# Patient Record
Sex: Female | Born: 1980 | Hispanic: No | Marital: Single | Smoking: Former smoker
Health system: Southern US, Community
[De-identification: ages and names within clinical notes are randomized; demographics above are authoritative.]

## PROBLEM LIST (undated history)

## (undated) DIAGNOSIS — M419 Scoliosis, unspecified: Secondary | ICD-10-CM

## (undated) DIAGNOSIS — K589 Irritable bowel syndrome without diarrhea: Secondary | ICD-10-CM

## (undated) DIAGNOSIS — M797 Fibromyalgia: Secondary | ICD-10-CM

## (undated) DIAGNOSIS — K900 Celiac disease: Secondary | ICD-10-CM

## (undated) DIAGNOSIS — G35 Multiple sclerosis: Secondary | ICD-10-CM

---

## 1999-11-29 ENCOUNTER — Emergency Department (HOSPITAL_COMMUNITY): Admission: EM | Admit: 1999-11-29 | Discharge: 1999-11-29 | Payer: Self-pay | Admitting: Emergency Medicine

## 2001-06-25 ENCOUNTER — Emergency Department (HOSPITAL_COMMUNITY): Admission: EM | Admit: 2001-06-25 | Discharge: 2001-06-25 | Payer: Self-pay | Admitting: Emergency Medicine

## 2001-09-14 ENCOUNTER — Encounter: Payer: Self-pay | Admitting: Emergency Medicine

## 2001-09-14 ENCOUNTER — Emergency Department (HOSPITAL_COMMUNITY): Admission: EM | Admit: 2001-09-14 | Discharge: 2001-09-15 | Payer: Self-pay | Admitting: Emergency Medicine

## 2005-05-16 ENCOUNTER — Ambulatory Visit (HOSPITAL_COMMUNITY): Admission: RE | Admit: 2005-05-16 | Discharge: 2005-05-16 | Payer: Self-pay | Admitting: *Deleted

## 2005-08-28 ENCOUNTER — Encounter: Admission: RE | Admit: 2005-08-28 | Discharge: 2005-08-28 | Payer: Self-pay | Admitting: Psychiatry

## 2010-07-09 ENCOUNTER — Emergency Department (HOSPITAL_BASED_OUTPATIENT_CLINIC_OR_DEPARTMENT_OTHER)
Admission: EM | Admit: 2010-07-09 | Discharge: 2010-07-09 | Disposition: A | Discharge status: Home / Self Care | Payer: Self-pay | Attending: Emergency Medicine | Admitting: Emergency Medicine

## 2010-07-09 DIAGNOSIS — I10 Essential (primary) hypertension: Secondary | ICD-10-CM | POA: Insufficient documentation

## 2010-07-09 DIAGNOSIS — R197 Diarrhea, unspecified: Secondary | ICD-10-CM | POA: Insufficient documentation

## 2010-07-09 DIAGNOSIS — G35 Multiple sclerosis: Secondary | ICD-10-CM | POA: Insufficient documentation

## 2010-07-09 DIAGNOSIS — R112 Nausea with vomiting, unspecified: Secondary | ICD-10-CM | POA: Insufficient documentation

## 2010-07-09 LAB — COMPREHENSIVE METABOLIC PANEL
AST: 20 U/L (ref 0–37)
Albumin: 4.4 g/dL (ref 3.5–5.2)
Alkaline Phosphatase: 62 U/L (ref 39–117)
BUN: 7 mg/dL (ref 6–23)
CO2: 20 mEq/L (ref 19–32)
Calcium: 9.9 mg/dL (ref 8.4–10.5)
Chloride: 106 mEq/L (ref 96–112)
Creatinine, Ser: 0.7 mg/dL (ref 0.50–1.10)
GFR calc Af Amer: >60 mL/min (ref >60)
GFR calc non Af Amer: >60 mL/min (ref >60)
Glucose, Bld: 97 mg/dL (ref 70–99)
Potassium: 3.9 mEq/L (ref 3.5–5.1)
Sodium: 140 mEq/L (ref 135–145)
Total Bilirubin: 0.5 mg/dL (ref 0.3–1.2)
Total Protein: 7.7 g/dL (ref 6.0–8.3)

## 2010-07-09 LAB — URINALYSIS, ROUTINE W REFLEX MICROSCOPIC
Glucose, UA: NEGATIVE mg/dL
Hgb urine dipstick: NEGATIVE
Leukocytes, UA: NEGATIVE
Nitrite: NEGATIVE
Protein, ur: NEGATIVE mg/dL
Specific Gravity, Urine: 1.022 (ref 1.005–1.030)
Urobilinogen, UA: 0.2 mg/dL (ref 0.0–1.0)
pH: 7.5 (ref 5.0–8.0)

## 2010-07-09 LAB — PREGNANCY, URINE: Preg Test, Ur: NEGATIVE

## 2010-10-08 ENCOUNTER — Emergency Department (HOSPITAL_BASED_OUTPATIENT_CLINIC_OR_DEPARTMENT_OTHER)
Admission: EM | Admit: 2010-10-08 | Discharge: 2010-10-08 | Disposition: A | Discharge status: Home / Self Care | Payer: Self-pay | Attending: Emergency Medicine | Admitting: Emergency Medicine

## 2010-10-08 ENCOUNTER — Emergency Department (INDEPENDENT_AMBULATORY_CARE_PROVIDER_SITE_OTHER): Payer: Self-pay

## 2010-10-08 DIAGNOSIS — M25559 Pain in unspecified hip: Secondary | ICD-10-CM | POA: Insufficient documentation

## 2010-10-08 DIAGNOSIS — Z79899 Other long term (current) drug therapy: Secondary | ICD-10-CM | POA: Insufficient documentation

## 2010-10-08 DIAGNOSIS — G35 Multiple sclerosis: Secondary | ICD-10-CM | POA: Insufficient documentation

## 2010-10-08 DIAGNOSIS — J45909 Unspecified asthma, uncomplicated: Secondary | ICD-10-CM | POA: Insufficient documentation

## 2010-10-08 DIAGNOSIS — K589 Irritable bowel syndrome without diarrhea: Secondary | ICD-10-CM | POA: Insufficient documentation

## 2010-10-08 HISTORY — DX: Irritable bowel syndrome without diarrhea: K58.9

## 2010-10-08 HISTORY — DX: Fibromyalgia: M79.7

## 2010-10-08 HISTORY — DX: Celiac disease: K90.0

## 2010-10-08 HISTORY — DX: Scoliosis, unspecified: M41.9

## 2010-10-08 HISTORY — DX: Multiple sclerosis: G35

## 2010-10-08 MED ORDER — HYDROCODONE-ACETAMINOPHEN 5-325 MG PO TABS: 2.0000 | ORAL_TABLET | ORAL | Status: AC | PRN

## 2010-10-08 NOTE — ED Provider Notes (Signed)
History     CSN: [id number] Arrival date & time: 10/08/2010  5:12 PM  Chief Complaint  Patient presents with  . Hip Pain    HPI  (Consider location/radiation/quality/duration/timing/severity/associated sxs/prior treatment)  Patient is a 30 y.o. female presenting with hip pain. The history is provided by the patient. No language interpreter was used.  Hip Pain This is a new problem. The current episode started 1 to 4 weeks ago. The problem occurs constantly. The problem has been gradually worsening. Associated symptoms include joint swelling. The symptoms are aggravated by nothing. She has tried nothing for the symptoms. The treatment provided no relief.  Pt reports she has a history of MS.  Pt reports she does not currently have a physician because she lost her insurance when she finished school.  Past Medical History  Diagnosis Date  . Fibromyalgia   . Multiple sclerosis   . Celiac disease   . Asthma   . IBS (irritable bowel syndrome)   . Scoliosis     History reviewed. No pertinent past surgical history.  No family history on file.  History  Substance Use Topics  . Smoking status: Never Smoker   . Smokeless tobacco: Not on file  . Alcohol Use: Yes    OB History    Grav Para Term Preterm Abortions TAB SAB Ect Mult Living                  Review of Systems  Review of Systems  Musculoskeletal: Positive for joint swelling.  All other systems reviewed and are negative.    Allergies  Penicillins; Sulfa antibiotics; Dayquil; Gluten; Lactulose; Morphine and related; Nyquil; Other; and Zofran  Home Medications   Current Outpatient Rx  Name Route Sig Dispense Refill  . ALPHA LIPOIC ACID PO Oral Take 1 tablet by mouth 2 (two) times daily.      . CHASTE TREE 20 MG PO TABS Oral Take 1 tablet by mouth 2 (two) times daily.      Marland Kitchen VITAMIN D 1000 UNITS PO TABS Oral Take 1,000 Units by mouth daily.      . CO Q 10 100 MG PO CAPS Oral Take 1 tablet by mouth daily.      Marland Kitchen  VITAMIN B-12 IJ Injection Inject 1,000 mg as directed every 7 (seven) days. Give on Wednesday     . EVENING PRIMROSE OIL 500 MG PO CAPS Oral Take 2 capsules by mouth daily.      Marland Kitchen HYDROCODONE-ACETAMINOPHEN 5-325 MG PO TABS Oral Take 1 tablet by mouth once.      . IBUPROFEN 200 MG PO TABS Oral Take 800 mg by mouth every 6 (six) hours as needed. pain     . IODINE (KELP) PO Oral Take 1 tablet by mouth daily.      Marland Kitchen MAGNESIUM GLUCONATE 500 MG PO TABS Oral Take 1,500 mg by mouth daily.      Marland Kitchen ONE-DAILY MULTI VITAMINS PO TABS Oral Take 1 tablet by mouth daily.      Marland Kitchen OVER THE COUNTER MEDICATION Oral Take 1 capsule by mouth 2 (two) times daily. Myelin Capsules       Physical Exam    BP 145/101  Pulse 85  Temp 98.3 F (36.8 C)  Resp 16  Ht 5\' 4"  (1.626 m)  Wt 163 lb (73.936 kg)  BMI 27.98 kg/m2  SpO2 100%  Physical Exam  Nursing note and vitals reviewed. Constitutional: She is oriented to person, place, and time. She appears  well-developed and well-nourished.  HENT:  Head: Normocephalic.  Neck: Normal range of motion.  Pulmonary/Chest: Effort normal.  Abdominal: Soft.  Musculoskeletal: She exhibits tenderness.       Pain with flexion of hip,  Pain with internal and external rotation  Neurological: She is alert and oriented to person, place, and time.  Skin: Skin is warm and dry.  Psychiatric: She has a normal mood and affect.    ED Course  Procedures (including critical care time)  Labs Reviewed - No data to display Dg Hip Complete Right  10/08/2010  *RADIOLOGY REPORT*  Clinical Data: Right hip pain  RIGHT HIP - COMPLETE 2+ VIEW  Comparison: None.  Findings: Three views of the right hip submitted.  No acute fracture or subluxation.  Pelvic phleboliths are noted. Moderate colonic stool.  IMPRESSION: No acute fracture or subluxation.  Original Report Authenticated By: Natasha Mead, M.D.     No diagnosis found.   MDM Pt advised to follow up with Dr. Herbert Moors,  Ice to area of pain.   Pain medication        Langston Masker, [place] 10/08/10 1831  Langston Masker, [place] 10/08/10 [phone]

## 2010-10-08 NOTE — ED Notes (Signed)
Right pedal pulse present. Cap refill <3 secs.

## 2010-10-08 NOTE — ED Notes (Signed)
Pain to right hip x 2 weeks-worse x 3-4 days-"felt it pop doing yoga" 2 weeks ago-unable to walk on it/muscle spasms

## 2010-10-08 NOTE — ED Provider Notes (Signed)
History/physical exam/procedure(s) were performed by non-physician practitioner and as supervising physician I was immediately available for consultation/collaboration. I have reviewed all notes and am in agreement with care and plan.   Hilario Quarry, MD 10/08/10 [phone]

## 2010-10-19 ENCOUNTER — Emergency Department (HOSPITAL_COMMUNITY)
Admission: EM | Admit: 2010-10-19 | Discharge: 2010-10-19 | Disposition: A | Discharge status: Home / Self Care | Payer: Self-pay | Attending: Emergency Medicine | Admitting: Emergency Medicine

## 2010-10-19 ENCOUNTER — Emergency Department (HOSPITAL_COMMUNITY): Payer: Self-pay

## 2010-10-19 DIAGNOSIS — IMO0001 Reserved for inherently not codable concepts without codable children: Secondary | ICD-10-CM | POA: Insufficient documentation

## 2010-10-19 DIAGNOSIS — G35 Multiple sclerosis: Secondary | ICD-10-CM | POA: Insufficient documentation

## 2010-10-19 DIAGNOSIS — I10 Essential (primary) hypertension: Secondary | ICD-10-CM | POA: Insufficient documentation

## 2010-10-19 DIAGNOSIS — M545 Low back pain, unspecified: Secondary | ICD-10-CM | POA: Insufficient documentation

## 2010-10-19 DIAGNOSIS — M79609 Pain in unspecified limb: Secondary | ICD-10-CM | POA: Insufficient documentation

## 2010-10-19 DIAGNOSIS — K900 Celiac disease: Secondary | ICD-10-CM | POA: Insufficient documentation

## 2010-10-19 DIAGNOSIS — M25559 Pain in unspecified hip: Secondary | ICD-10-CM | POA: Insufficient documentation

## 2010-10-19 DIAGNOSIS — M25569 Pain in unspecified knee: Secondary | ICD-10-CM | POA: Insufficient documentation

## 2011-01-12 ENCOUNTER — Encounter (HOSPITAL_COMMUNITY): Payer: Self-pay | Admitting: *Deleted

## 2011-01-12 ENCOUNTER — Emergency Department (HOSPITAL_COMMUNITY)
Admission: EM | Admit: 2011-01-12 | Discharge: 2011-01-12 | Disposition: A | Discharge status: Home / Self Care | Payer: Self-pay | Attending: Emergency Medicine | Admitting: Emergency Medicine

## 2011-01-12 DIAGNOSIS — R5381 Other malaise: Secondary | ICD-10-CM | POA: Insufficient documentation

## 2011-01-12 DIAGNOSIS — R059 Cough, unspecified: Secondary | ICD-10-CM | POA: Insufficient documentation

## 2011-01-12 DIAGNOSIS — H9209 Otalgia, unspecified ear: Secondary | ICD-10-CM | POA: Insufficient documentation

## 2011-01-12 DIAGNOSIS — B349 Viral infection, unspecified: Secondary | ICD-10-CM

## 2011-01-12 DIAGNOSIS — G35 Multiple sclerosis: Secondary | ICD-10-CM | POA: Insufficient documentation

## 2011-01-12 DIAGNOSIS — R112 Nausea with vomiting, unspecified: Secondary | ICD-10-CM | POA: Insufficient documentation

## 2011-01-12 DIAGNOSIS — B9789 Other viral agents as the cause of diseases classified elsewhere: Secondary | ICD-10-CM | POA: Insufficient documentation

## 2011-01-12 DIAGNOSIS — R05 Cough: Secondary | ICD-10-CM | POA: Insufficient documentation

## 2011-01-12 LAB — POCT I-STAT, CHEM 8
BUN: 7 mg/dL (ref 6–23)
Calcium, Ion: 1.18 mmol/L (ref 1.12–1.32)
Chloride: 108 meq/L (ref 96–112)
Creatinine, Ser: 0.9 mg/dL (ref 0.50–1.10)
Glucose, Bld: 84 mg/dL (ref 70–99)
HCT: 37 % (ref 36.0–46.0)
Hemoglobin: 12.6 g/dL (ref 12.0–15.0)
Potassium: 4 meq/L (ref 3.5–5.1)
Sodium: 145 meq/L (ref 135–145)
TCO2: 24 mmol/L (ref 0–100)

## 2011-01-12 MED ORDER — PROMETHAZINE HCL 25 MG/ML IJ SOLN
25.0000 mg | Freq: Once | INTRAMUSCULAR | Status: AC
  Administered 2011-01-12: 25 mg via INTRAMUSCULAR
  Filled 2011-01-12: qty 1

## 2011-01-12 MED ORDER — PROMETHAZINE HCL 25 MG PO TABS: 25.0000 mg | ORAL_TABLET | Freq: Four times a day (QID) | ORAL | Status: AC | PRN

## 2011-01-12 MED ORDER — FAMOTIDINE 20 MG PO TABS: 20.0000 mg | ORAL_TABLET | Freq: Two times a day (BID) | ORAL | Status: DC

## 2011-01-12 NOTE — ED Notes (Signed)
Pt in c/o n/v, fever, body aches, cough and congestion x3-4 days

## 2011-01-12 NOTE — ED Notes (Signed)
Patient is alert and oriented x3.  She has been given DC instructions with MD follow up visits.  Verbal confirmation was given by patient V/S stable.  Patient DC under own ambulatory power. Patient was not showing any signs of distress on DC  

## 2011-01-12 NOTE — ED Provider Notes (Signed)
History     CSN: [id number]  Arrival date & time 01/12/11  0110   First MD Initiated Contact with Patient 01/12/11 [phone]      Chief Complaint  Patient presents with  . Influenza     HPI  History provided by the patient and friend. Patient is a 30 year old female with history of celiac disease, irritable bowel syndrome and fibromyalgia who presents with complaints of generalized body aches, fever, cough and nausea and vomiting. Patient reports his symptoms began on Christmas Eve 4 days ago with fever and cough. Patient also reports having significant amounts of mucus and congestion. For the past 2 days patient reports having persistent nausea and vomiting to the point of not being able to keep any food or liquids down. Patient has not been able to take any medicines without vomiting. She has not reported any alleviating factors. Patient denies any diarrhea or abdominal pains. Patient has no other significant medical problems.   Past Medical History  Diagnosis Date  . Fibromyalgia   . Multiple sclerosis   . Celiac disease   . Asthma   . IBS (irritable bowel syndrome)   . Scoliosis     History reviewed. No pertinent past surgical history.  History reviewed. No pertinent family history.  History  Substance Use Topics  . Smoking status: Never Smoker   . Smokeless tobacco: Not on file  . Alcohol Use: Yes    OB History    Grav Para Term Preterm Abortions TAB SAB Ect Mult Living                  Review of Systems  Constitutional: Positive for fever, chills, activity change, appetite change and fatigue.  HENT: Positive for ear pain and congestion.   Respiratory: Positive for cough. Negative for shortness of breath.   Cardiovascular: Negative for chest pain.  Gastrointestinal: Positive for nausea and vomiting. Negative for abdominal pain, diarrhea and constipation.  Genitourinary: Negative for dysuria, hematuria and flank pain.  Neurological: Negative for dizziness and  light-headedness.  All other systems reviewed and are negative.    Allergies  Penicillins; Sulfa antibiotics; Dayquil; Gluten; Lactulose; Morphine and related; Nyquil; Other; and Zofran  Home Medications   Current Outpatient Rx  Name Route Sig Dispense Refill  . ALPHA LIPOIC ACID PO Oral Take 1 tablet by mouth 2 (two) times daily.      . CHASTE TREE 20 MG PO TABS Oral Take 1 tablet by mouth 2 (two) times daily.      Marland Kitchen VITAMIN D 1000 UNITS PO TABS Oral Take 1,000 Units by mouth daily.      . CO Q 10 100 MG PO CAPS Oral Take 1 tablet by mouth daily.      Marland Kitchen VITAMIN B-12 IJ Injection Inject 1,000 mg as directed every 7 (seven) days. Give on Wednesday     . EVENING PRIMROSE OIL 500 MG PO CAPS Oral Take 2 capsules by mouth daily.      Marland Kitchen HYDROCODONE-ACETAMINOPHEN 5-325 MG PO TABS Oral Take 1 tablet by mouth once.      . IBUPROFEN 200 MG PO TABS Oral Take 800 mg by mouth every 6 (six) hours as needed. pain     . IODINE (KELP) PO Oral Take 1 tablet by mouth daily.      Marland Kitchen MAGNESIUM GLUCONATE 500 MG PO TABS Oral Take 1,500 mg by mouth daily.      Marland Kitchen ONE-DAILY MULTI VITAMINS PO TABS Oral Take 1 tablet by  mouth daily.      Marland Kitchen OVER THE COUNTER MEDICATION Oral Take 1 capsule by mouth 2 (two) times daily. Myelin Capsules       BP 159/100  Pulse 80  Temp(Src) 99 F (37.2 C) (Oral)  Resp 20  SpO2 100%  Physical Exam  Nursing note and vitals reviewed. Constitutional: She is oriented to person, place, and time. She appears well-developed and well-nourished. No distress.  HENT:  Head: Normocephalic.  Right Ear: External ear normal.  Left Ear: External ear normal.       Oropharynx is erythematous with some cobblestoning  Eyes: Conjunctivae and EOM are normal. Pupils are equal, round, and reactive to light.  Neck: Normal range of motion. Neck supple.       No meningeal signs  Cardiovascular: Normal rate, regular rhythm and normal heart sounds.   Pulmonary/Chest: Effort normal and breath sounds  normal. No respiratory distress. She has no wheezes. She has no rales.  Abdominal: Soft. Bowel sounds are normal. She exhibits no distension. There is no tenderness. There is no rebound and no guarding.  Musculoskeletal: She exhibits no edema and no tenderness.  Lymphadenopathy:    She has no cervical adenopathy.  Neurological: She is alert and oriented to person, place, and time.  Skin: Skin is warm and dry. No rash noted.  Psychiatric: She has a normal mood and affect. Her behavior is normal.    ED Course  Procedures (including critical care time)   Labs Reviewed  I-STAT, CHEM 8   Results for orders placed during the hospital encounter of 01/12/11  POCT I-STAT, CHEM 8      Component Value Range   Sodium 145  135 - 145 (mEq/L)   Potassium 4.0  3.5 - 5.1 (mEq/L)   Chloride 108  96 - 112 (mEq/L)   BUN 7  6 - 23 (mg/dL)   Creatinine, Ser 1.61  0.50 - 1.10 (mg/dL)   Glucose, Bld 84  70 - 99 (mg/dL)   Calcium, Ion 0.96  0.45 - 1.32 (mmol/L)   TCO2 24  0 - 100 (mmol/L)   Hemoglobin 12.6  12.0 - 15.0 (g/dL)   HCT 40.9  81.1 - 91.4 (%)      1. Viral infection   2. Nausea & vomiting       MDM  3:20 AM patient seen and evaluated. Patient in no acute distress. Patient is nontoxic appearing   Vision feeling much better after nausea medications. She is tolerating by mouth fluids well. Patient has normal i-STAT. At this time patient feels like she is ready to return home. Plan to give her prescriptions for nausea medications with instructions for simple BRAT diet.     Angus Seller, [place] 01/12/11 [phone]

## 2011-01-12 NOTE — ED Provider Notes (Signed)
Medical screening examination/treatment/procedure(s) were performed by non-physician practitioner and as supervising physician I was immediately available for consultation/collaboration.   Carlo Guevarra M Jacquiline Zurcher, MD 01/12/11 0815 

## 2011-02-27 ENCOUNTER — Emergency Department (HOSPITAL_COMMUNITY)
Admission: EM | Admit: 2011-02-27 | Discharge: 2011-02-28 | Disposition: A | Discharge status: Home / Self Care | Payer: Self-pay | Attending: Emergency Medicine | Admitting: Emergency Medicine

## 2011-02-27 ENCOUNTER — Encounter (HOSPITAL_COMMUNITY): Payer: Self-pay | Admitting: Emergency Medicine

## 2011-02-27 DIAGNOSIS — K589 Irritable bowel syndrome without diarrhea: Secondary | ICD-10-CM | POA: Insufficient documentation

## 2011-02-27 DIAGNOSIS — IMO0001 Reserved for inherently not codable concepts without codable children: Secondary | ICD-10-CM | POA: Insufficient documentation

## 2011-02-27 DIAGNOSIS — M25551 Pain in right hip: Secondary | ICD-10-CM

## 2011-02-27 DIAGNOSIS — M25559 Pain in unspecified hip: Secondary | ICD-10-CM | POA: Insufficient documentation

## 2011-02-27 DIAGNOSIS — M79609 Pain in unspecified limb: Secondary | ICD-10-CM | POA: Insufficient documentation

## 2011-02-27 DIAGNOSIS — G35 Multiple sclerosis: Secondary | ICD-10-CM | POA: Insufficient documentation

## 2011-02-27 NOTE — ED Notes (Signed)
Pt alert, nad, c/o right leg pain, onset last October, pt denies recent trauma or injury, ambulates to triage, request wheelchair, resp even unlabored, skin pwd

## 2011-02-27 NOTE — ED Provider Notes (Signed)
History     CSN: [id number]  Arrival date & time 02/27/11  2223   First MD Initiated Contact with Patient 02/27/11 2349      Chief Complaint  Patient presents with  . Leg Pain    Right    (Consider location/radiation/quality/duration/timing/severity/associated sxs/prior treatment) The history is provided by the patient.    31 year old female with history of right hip laxity is presenting to the ED with chief complaints of right hip pain and right leg pain. Patient states for the past several months she has been having hip dislocation that happens on a fairly frequent basis. She usually notice her hip bone "pops out of its joint", which can be very painful. Sometimes the right hip bone will return back to his normal location, and sometime she may need a friend to help reposition it back in place. Patient state she can sometimes walk on it but has noticed increase in pain and muscle spasm. She also is presenting burning sensation down to her leg muscle. She is currently denying right knee pain. She has not followed up with any specialist. She has been taking Tylenol, and ibuprofen for pain which has helped somewhat. She denies any recent trauma. She has been seen for the symptoms back in November of 2012. At which time, an MRI of her right hip and x-ray of the low back, and right knee were obtained. Results were unremarkable.  Past Medical History  Diagnosis Date  . Fibromyalgia   . Multiple sclerosis   . Celiac disease   . Asthma   . IBS (irritable bowel syndrome)   . Scoliosis     History reviewed. No pertinent past surgical history.  No family history on file.  History  Substance Use Topics  . Smoking status: Never Smoker   . Smokeless tobacco: Not on file  . Alcohol Use: Yes    OB History    Grav Para Term Preterm Abortions TAB SAB Ect Mult Living                  Review of Systems  All other systems reviewed and are negative.    Allergies  Penicillins; Sulfa  antibiotics; Benadryl allergy; Dayquil; Gluten; Lactulose; Morphine and related; Nyquil; Other; and Zofran  Home Medications   Current Outpatient Rx  Name Route Sig Dispense Refill  . ACETAMINOPHEN 325 MG PO TABS Oral Take 650 mg by mouth every 6 (six) hours as needed. pain    . ALPHA LIPOIC ACID PO Oral Take 1 tablet by mouth 2 (two) times daily.      . CHASTE TREE 20 MG PO TABS Oral Take 1 tablet by mouth 2 (two) times daily.      Marland Kitchen VITAMIN D 1000 UNITS PO TABS Oral Take 1,000 Units by mouth daily.      . CO Q 10 100 MG PO CAPS Oral Take 1 tablet by mouth daily.      Marland Kitchen EVENING PRIMROSE OIL 500 MG PO CAPS Oral Take 2 capsules by mouth daily.      . IBUPROFEN 200 MG PO TABS Oral Take 800 mg by mouth every 6 (six) hours as needed. pain    . IODINE (KELP) PO Oral Take 1 tablet by mouth daily.      Marland Kitchen MAGNESIUM GLUCONATE 500 MG PO TABS Oral Take 1,500 mg by mouth daily.      Marland Kitchen ONE-DAILY MULTI VITAMINS PO TABS Oral Take 1 tablet by mouth daily.      Marland Kitchen  TIZANIDINE HCL 4 MG PO TABS Oral Take 4 mg by mouth every 6 (six) hours as needed. Muscle relaxer       BP 158/102  Pulse 81  Temp(Src) 98.3 F (36.8 C) (Oral)  Resp 16  Wt 163 lb (73.936 kg)  SpO2 100%  Physical Exam  Nursing note and vitals reviewed. Constitutional: She appears well-nourished. No distress.  HENT:  Head: Atraumatic.  Eyes: Conjunctivae are normal.  Neck: Neck supple.  Musculoskeletal:       Right hip: She exhibits decreased strength and tenderness. She exhibits normal range of motion, no bony tenderness, no crepitus and no deformity.       Right knee: Normal.       Right ankle: Normal.  Neurological: She is alert.  Skin: Skin is warm.    ED Course  Procedures (including critical care time)  Labs Reviewed - No data to display No results found.   No diagnosis found.   MR Hip Right Wo Contrast (Final result)   Result time:10/19/10 1841    Final result    Narrative:   *RADIOLOGY REPORT*  Clinical Data:  Right hip pain. No trauma.  MRI RIGHT HIP WITHOUT CONTRAST  Technique: Multiplanar, multisequence MR imaging of the right hip was performed. No intravenous contrast was administered.  Comparison: 10/08/2010.  Findings: Bone marrow signal is normal. No effusion. No fracture. The anatomic pelvis appears within normal limits. Physiologic appearance of the uterus and adnexa. The SI joints appear normal bilaterally. No erosions or bone marrow edema. Muscular signal is normal bilaterally. Dedicated sagittal images of the right hip are normal. No labral tear. No paralabral cyst.  IMPRESSION: Negative MRI of the right hip. Incidental visualization of the left hip also appears normal.  Original Report Authenticated By: Andreas Newport, M.D.            DG Lumbar Spine Complete (Final result)   Result time:10/19/10 1803    Final result    Narrative:   *RADIOLOGY REPORT*  Clinical Data: Right-sided low back pain and right hip pain for several weeks.  LUMBAR SPINE - COMPLETE 4+ VIEW  Comparison: None.  Findings: There are no pars defects identified. Vertebral body height is preserved. The alignment is anatomic. Five lumbar type vertebral bodies. There is either rudimentary right L1 rib or a nonunited transverse process fracture. Regardless, this as well corticated appears chronic. Lumbosacral junction appears within normal limits. There is no disc space loss. No facet arthrosis.  IMPRESSION: No acute osseous abnormality.  Original Report Authenticated By: Andreas Newport, M.D.            DG Knee Complete 4 Views Right (Final result)   Result time:10/19/10 1753    Final result    Narrative:   *RADIOLOGY REPORT*  Clinical Data: Right knee pain.  RIGHT KNEE - COMPLETE 4+ VIEW  Comparison: None.  Findings: Anatomic alignment. No fracture. No effusion. The joint spaces are preserved.  IMPRESSION: Negative.  Original Report Authenticated By: Andreas Newport, M.D.      MDM  Patient usually walks with a cane. She is able to ambulate with a mild antalgic gait favoring her left side. On evaluation, she has full mobility to her R hip. No obvious deformity or dislocation noted. Tenderness can't be palpated to the right hip. Worsening pain with varus maneuver and valgus maneuver.  No crepitus noted no overlying skin changes.  I believe patient will benefit from further evaluation by a specialist. I will give her the appropriate  referral. I will also prescribe pain medication and muscle relaxant. Patient voiced understanding and agree with plan. No evidence of hip dislocation at this time.   Medical screening examination/treatment/procedure(s) were performed by non-physician practitioner and as supervising physician I was immediately available for consultation/collaboration. Osvaldo Human, M.D.      Fayrene Helper, PA-C 02/28/11 0009  Carleene Cooper III, MD 02/28/11 [phone]

## 2011-02-28 MED ORDER — CYCLOBENZAPRINE HCL 5 MG PO TABS: 5.0000 mg | ORAL_TABLET | Freq: Three times a day (TID) | ORAL | Status: AC | PRN

## 2011-02-28 MED ORDER — OXYCODONE-ACETAMINOPHEN 5-325 MG PO TABS: 2.0000 | ORAL_TABLET | ORAL | Status: AC | PRN

## 2011-02-28 NOTE — Discharge Instructions (Signed)
Please followup with an orthopedic specialist, number below, for further evaluation of your right hip problem.  You may take muscle relaxant and pain medication as prescribed. Do not take it if you're allergic to these medication. Do not drive or operate heavy machinery while taking pain medication all muscle relaxant  Hip Pain The hips join the upper legs to the lower pelvis. The bones, cartilage, tendons, and muscles of the hip joint perform a lot of work each day holding your body weight and allowing you to move around. Hip pain is a common symptom. It can range from a minor ache to severe pain on 1 or both hips. Pain may be felt on the inside of the hip joint near the groin, or the outside near the buttocks and upper thigh. There may be swelling or stiffness as well. It occurs more often when a person walks or performs activity. There are many reasons hip pain can develop. CAUSES  It is important to work with your caregiver to identify the cause since many conditions can impact the bones, cartilage, muscles, and tendons of the hips. Causes for hip pain include:  Broken (fractured) bones.   Separation of the thighbone from the hip socket (dislocation).   Torn cartilage of the hip joint.   Swelling (inflammation) of a tendon (tendonitis), the sac within the hip joint (bursitis), or a joint.   A weakening in the abdominal wall (hernia), affecting the nerves to the hip.   Arthritis in the hip joint or lining of the hip joint.   Pinched nerves in the back, hip, or upper thigh.   A bulging disc in the spine (herniated disc).   Rarely, bone infection or cancer.  DIAGNOSIS  The location of your hip pain will help your caregiver understand what may be causing the pain. A diagnosis is based on your medical history, your symptoms, results from your physical exam, and results from diagnostic tests. Diagnostic tests may include X-ray exams, a computerized magnetic scan (magnetic resonance imaging,  MRI), or bone scan. TREATMENT  Treatment will depend on the cause of your hip pain. Treatment may include:  Limiting activities and resting until symptoms improve.   Crutches or other walking supports (a cane or brace).   Ice, elevation, and compression.   Physical therapy or home exercises.   Shoe inserts or special shoes.   Losing weight.   Medications to reduce pain.   Undergoing surgery.  HOME CARE INSTRUCTIONS   Only take over-the-counter or prescription medicines for pain, discomfort, or fever as directed by your caregiver.   Put ice on the injured area:   Put ice in a plastic bag.   Place a towel between your skin and the bag.   Leave the ice on for 15 to 20 minutes at a time, 3 to 4 times a day.   Keep your leg raised (elevated) when possible to lessen swelling.   Avoid activities that cause pain.   Follow specific exercises as directed by your caregiver.   Sleep with a pillow between your legs on your most comfortable side.   Record how often you have hip pain, the location of the pain, and what it feels like. This information may be helpful to you and your caregiver.   Ask your caregiver about returning to work or sports and whether you should drive.   Follow up with your caregiver for further exams, therapy, or testing as directed.  SEEK MEDICAL CARE IF:   Your pain or  swelling continues or worsens after 1 week.   You are feeling unwell or have chills.   You have increasing difficulty with walking.   You have a loss of sensation or other new symptoms.   You have questions or concerns.  SEEK IMMEDIATE MEDICAL CARE IF:   You cannot put weight on the affected hip.   You have fallen.   You have a sudden increase in pain and swelling in your hip.   You have a fever.  MAKE SURE YOU:   Understand these instructions.   Will watch your condition.   Will get help right away if you are not doing well or get worse.  Document Released: 06/20/2009  Document Revised: 09/12/2010 Document Reviewed: 06/20/2009 [facility] Patient Information 2012 [place], [place].

## 2014-11-17 ENCOUNTER — Emergency Department (HOSPITAL_BASED_OUTPATIENT_CLINIC_OR_DEPARTMENT_OTHER): Payer: Self-pay

## 2014-11-17 ENCOUNTER — Emergency Department (HOSPITAL_BASED_OUTPATIENT_CLINIC_OR_DEPARTMENT_OTHER)
Admission: EM | Admit: 2014-11-17 | Discharge: 2014-11-17 | Disposition: A | Discharge status: Home / Self Care | Payer: Self-pay | Attending: Emergency Medicine | Admitting: Emergency Medicine

## 2014-11-17 ENCOUNTER — Encounter (HOSPITAL_BASED_OUTPATIENT_CLINIC_OR_DEPARTMENT_OTHER): Payer: Self-pay | Admitting: *Deleted

## 2014-11-17 DIAGNOSIS — M5431 Sciatica, right side: Secondary | ICD-10-CM | POA: Insufficient documentation

## 2014-11-17 DIAGNOSIS — Z3202 Encounter for pregnancy test, result negative: Secondary | ICD-10-CM | POA: Insufficient documentation

## 2014-11-17 DIAGNOSIS — Z880 Allergy status to penicillin: Secondary | ICD-10-CM | POA: Insufficient documentation

## 2014-11-17 DIAGNOSIS — Z79899 Other long term (current) drug therapy: Secondary | ICD-10-CM | POA: Insufficient documentation

## 2014-11-17 DIAGNOSIS — Z8719 Personal history of other diseases of the digestive system: Secondary | ICD-10-CM | POA: Insufficient documentation

## 2014-11-17 DIAGNOSIS — Z8669 Personal history of other diseases of the nervous system and sense organs: Secondary | ICD-10-CM | POA: Insufficient documentation

## 2014-11-17 DIAGNOSIS — M419 Scoliosis, unspecified: Secondary | ICD-10-CM | POA: Insufficient documentation

## 2014-11-17 DIAGNOSIS — J45909 Unspecified asthma, uncomplicated: Secondary | ICD-10-CM | POA: Insufficient documentation

## 2014-11-17 LAB — PREGNANCY, URINE: PREG TEST UR: NEGATIVE

## 2014-11-17 MED ORDER — DIAZEPAM 5 MG PO TABS: 5.0000 mg | ORAL_TABLET | Freq: Three times a day (TID) | ORAL | Status: DC | PRN

## 2014-11-17 NOTE — ED Provider Notes (Signed)
CSN: [id number]     Arrival date & time 11/17/14  0258 History   First MD Initiated Contact with Patient 11/17/14 0319     Chief Complaint  Patient presents with  . Hip Pain     (Consider location/radiation/quality/duration/timing/severity/associated sxs/prior Treatment) HPI Comments: 34 year old female with past medical history including MS, fibromyalgia, celiac disease, asthma, IBS who presents with back, right hip, and leg pain. The patient states that for the past one week, she has had pain that starts in her right lower back and right lateral hip and radiates all the way down the back of her leg to her foot. The pain is a burning, "fiery" pain that is worse with certain movements. She denies any recent injury but does note that she has had some low back pain with it. She denies any recent injury or increase in physical activity although she frequently carries her child around. No fevers, vomiting, recent illness, skin changes, leg weakness/numbness, saddle anesthesia, or bowel/bladder incontinence.  Patient is a 35 y.o. female presenting with hip pain. The history is provided by the patient.  Hip Pain    Past Medical History  Diagnosis Date  . Fibromyalgia   . Multiple sclerosis (HCC)   . Celiac disease   . Asthma   . IBS (irritable bowel syndrome)   . Scoliosis    History reviewed. No pertinent past surgical history. No family history on file. Social History  Substance Use Topics  . Smoking status: Never Smoker   . Smokeless tobacco: None  . Alcohol Use: Yes   OB History    No data available     Review of Systems  10 Systems reviewed and are negative for acute change except as noted in the HPI.   Allergies  Penicillins; Sulfa antibiotics; Dayquil; Diphenhydramine hcl; Gluten; Lactulose; Morphine and related; Nyquil; Other; and Zofran  Home Medications   Prior to Admission medications   Medication Sig Start Date End Date Taking? Authorizing Provider   acetaminophen (TYLENOL) 325 MG tablet Take 650 mg by mouth every 6 (six) hours as needed. pain    Historical Provider, MD  ALPHA LIPOIC ACID PO Take 1 tablet by mouth 2 (two) times daily.      Historical Provider, MD  Chaste Tree 20 MG TABS Take 1 tablet by mouth 2 (two) times daily.      Historical Provider, MD  cholecalciferol (VITAMIN D) 1000 UNITS tablet Take 1,000 Units by mouth daily.      Historical Provider, MD  Coenzyme Q10 (CO Q 10) 100 MG CAPS Take 1 tablet by mouth daily.      Historical Provider, MD  diazepam (VALIUM) 5 MG tablet Take 1 tablet (5 mg total) by mouth every 8 (eight) hours as needed for muscle spasms. 11/17/14   Laurence Spates, MD  Evening Primrose Oil 500 MG CAPS Take 2 capsules by mouth daily.      Historical Provider, MD  ibuprofen (ADVIL,MOTRIN) 200 MG tablet Take 800 mg by mouth every 6 (six) hours as needed. pain    Historical Provider, MD  IODINE, KELP, PO Take 1 tablet by mouth daily.      Historical Provider, MD  magnesium gluconate (MAGONATE) 500 MG tablet Take 1,500 mg by mouth daily.      Historical Provider, MD  Multiple Vitamin (MULTIVITAMIN) tablet Take 1 tablet by mouth daily.      Historical Provider, MD   BP 164/100 mmHg  Pulse 58  Temp(Src) 98.6 F (37 C) (  Oral)  Resp 18  Ht 5\' 5"  (1.651 m)  Wt 170 lb (77.111 kg)  BMI 28.29 kg/m2  SpO2 100% Physical Exam  Constitutional: She is oriented to person, place, and time. She appears well-developed and well-nourished. No distress.  HENT:  Head: Normocephalic and atraumatic.  Moist mucous membranes  Eyes: Conjunctivae are normal. Pupils are equal, round, and reactive to light.  Neck: Neck supple.  Cardiovascular: Normal rate, regular rhythm and normal heart sounds.   No murmur heard. Pulmonary/Chest: Effort normal and breath sounds normal.  Abdominal: Soft. Bowel sounds are normal. She exhibits no distension. There is no tenderness.  Musculoskeletal: She exhibits no edema.  No midline  spinal tenderness, + R lumbar paraspinal muscle tenderness; 5/5 strength and normal sensation b/l LE; normal range of motion right hip and knee  Neurological: She is alert and oriented to person, place, and time. She exhibits normal muscle tone.  Fluent speech  Skin: Skin is warm and dry. No rash noted. No erythema.  Psychiatric: She has a normal mood and affect. Judgment normal.  Nursing note and vitals reviewed.   ED Course  Procedures (including critical care time) Labs Review Labs Reviewed  PREGNANCY, URINE     MDM   Final diagnoses:  Sciatica of right side   34 year old female who presents with 1 week of right hip and low back pain radiating down the back of her right leg which is worse with certain movements. Patient well-appearing at presentation with reassuring vital signs. She had a right lumbar paraspinal muscle tenderness. She states that she has had low back pain with her symptoms and her burning pain is different from previous episodes of isolated right hip pain which were more of an achy feeling. Her description is consistent with sciatica. Patient demonstrates no lower extremity weakness, saddle anesthesia, bowel or bladder incontinence, or any other neurologic deficits concerning for cauda equina. No fevers or other infectious symptoms to suggest by the patient's back pain is due to an infection. I have reviewed return precautions, including the development of any of these signs or symptoms, and the patient has voiced understanding. I reviewed supportive care instructions, including NSAIDs, early range of motion exercises, and PCP follow-up if symptoms do not improve for referral to physical therapy. Patient voiced understanding and was discharged in satisfactory condition.    Laurence Spates, MD 11/17/14 [phone]

## 2014-11-17 NOTE — ED Notes (Signed)
C/o rt hip pain x 1 week w some swelling   Denies inj   Hx of same

## 2014-11-17 NOTE — ED Notes (Signed)
Rt hip pain x 1 week  Denies inj  Hx of same

## 2016-03-12 ENCOUNTER — Emergency Department (HOSPITAL_BASED_OUTPATIENT_CLINIC_OR_DEPARTMENT_OTHER): Payer: Self-pay

## 2016-03-12 ENCOUNTER — Encounter (HOSPITAL_BASED_OUTPATIENT_CLINIC_OR_DEPARTMENT_OTHER): Payer: Self-pay | Admitting: *Deleted

## 2016-03-12 ENCOUNTER — Emergency Department (HOSPITAL_BASED_OUTPATIENT_CLINIC_OR_DEPARTMENT_OTHER)
Admission: EM | Admit: 2016-03-12 | Discharge: 2016-03-12 | Disposition: A | Discharge status: Home / Self Care | Payer: Self-pay | Attending: Emergency Medicine | Admitting: Emergency Medicine

## 2016-03-12 DIAGNOSIS — J45909 Unspecified asthma, uncomplicated: Secondary | ICD-10-CM | POA: Insufficient documentation

## 2016-03-12 DIAGNOSIS — M25511 Pain in right shoulder: Secondary | ICD-10-CM | POA: Insufficient documentation

## 2016-03-12 DIAGNOSIS — Z79899 Other long term (current) drug therapy: Secondary | ICD-10-CM | POA: Insufficient documentation

## 2016-03-12 MED ORDER — NAPROXEN 500 MG PO TABS: 500.0000 mg | ORAL_TABLET | Freq: Two times a day (BID) | ORAL | 0 refills | Status: AC

## 2016-03-12 MED ORDER — KETOROLAC TROMETHAMINE 30 MG/ML IJ SOLN
30.0000 mg | Freq: Once | INTRAMUSCULAR | Status: AC
  Administered 2016-03-12: 30 mg via INTRAMUSCULAR
  Filled 2016-03-12: qty 1

## 2016-03-12 NOTE — ED Provider Notes (Signed)
[facility] Provider Note   CSN: [id number] Arrival date & time: 03/12/16  1719  By signing my name below, I, Michele Marshall, attest that this documentation has been prepared under the direction and in the presence of Michele Hake, PA-C. Electronically Signed: Talbert Marshall, Scribe. 03/12/16. 5:56 PM.   History   Chief Complaint Chief Complaint  Patient presents with  . Arm Pain    HPI Michele Marshall is a 36 y.o. female with h/o MS, fibromyalgia who presents to the Emergency Department complaining of burning, fiery, stabbing, right shoulder pain that began this morning s/p picking up her 40 lb niece yesterday. Pain is exacerbated by movement. Pt has associated upper back pain. Pt has wrapped arm with ace wrap with intermittent relief. Pt tried icing her shoulder and it made the pain worse. Pt has taken 800 mg ibuprofen w/o relief at 2pm and Aleve without relief. She reports no recent fall or injury, apart from picking up her niece yesterday. Pt denies redness, swelling, rash, numbness, weakness, neck pain.  The history is provided by the patient. No language interpreter was used.    Past Medical History:  Diagnosis Date  . Asthma   . Celiac disease   . Fibromyalgia   . IBS (irritable bowel syndrome)   . Multiple sclerosis (HCC)   . Scoliosis     There are no active problems to display for this patient.   History reviewed. No pertinent surgical history.  OB History    No data available       Home Medications    Prior to Admission medications   Medication Sig Start Date End Date Taking? Authorizing Provider  Multiple Vitamin (MULTIVITAMIN) tablet Take 1 tablet by mouth daily.     Yes Historical Provider, MD  acetaminophen (TYLENOL) 325 MG tablet Take 650 mg by mouth every 6 (six) hours as needed. pain    Historical Provider, MD  ALPHA LIPOIC ACID PO Take 1 tablet by mouth 2 (two) times daily.      Historical Provider, MD  Chaste Tree 20 MG TABS Take 1 tablet by  mouth 2 (two) times daily.      Historical Provider, MD  cholecalciferol (VITAMIN D) 1000 UNITS tablet Take 1,000 Units by mouth daily.      Historical Provider, MD  Coenzyme Q10 (CO Q 10) 100 MG CAPS Take 1 tablet by mouth daily.      Historical Provider, MD  diazepam (VALIUM) 5 MG tablet Take 1 tablet (5 mg total) by mouth every 8 (eight) hours as needed for muscle spasms. 11/17/14   Laurence Spates, MD  Evening Primrose Oil 500 MG CAPS Take 2 capsules by mouth daily.      Historical Provider, MD  ibuprofen (ADVIL,MOTRIN) 200 MG tablet Take 800 mg by mouth every 6 (six) hours as needed. pain    Historical Provider, MD  IODINE, KELP, PO Take 1 tablet by mouth daily.      Historical Provider, MD  magnesium gluconate (MAGONATE) 500 MG tablet Take 1,500 mg by mouth daily.      Historical Provider, MD  naproxen (NAPROSYN) 500 MG tablet Take 1 tablet (500 mg total) by mouth 2 (two) times daily. 03/12/16   Barrett Henle, PA-C    Family History No family history on file.  Social History Social History  Substance Use Topics  . Smoking status: Never Smoker  . Smokeless tobacco: Never Used  . Alcohol use Yes     Allergies  Penicillins; Sulfa antibiotics; Dayquil [pseudoephedrine-dm-gg-apap]; Diphenhydramine hcl; Gluten; Lactulose; Morphine and related; Nyquil [pseudoeph-doxylamine-dm-apap]; Other; and Zofran   Review of Systems Review of Systems  Constitutional: Negative for fever.  Musculoskeletal: Positive for arthralgias (right shoulder). Negative for joint swelling.  Skin: Negative for wound.  Neurological: Negative for weakness and numbness.     Physical Exam Updated Vital Signs BP (!) 157/104   Pulse 84   Temp 98.1 F (36.7 C) (Oral)   Resp 20   Ht 5\' 4"  (1.626 m)   Wt 77.1 kg   LMP 02/23/2016   SpO2 99%   BMI 29.18 kg/m   Physical Exam  Constitutional: She is oriented to person, place, and time. She appears well-developed and well-nourished.  HENT:    Head: Normocephalic and atraumatic.  Eyes: Conjunctivae and EOM are normal. Right eye exhibits no discharge. Left eye exhibits no discharge. No scleral icterus.  Neck: Normal range of motion. Neck supple.  Cardiovascular: Normal rate and intact distal pulses.   Pulmonary/Chest: Effort normal.  Musculoskeletal: Normal range of motion. She exhibits tenderness. She exhibits no edema or deformity.  Tender to palpation over right lateral trapezius, deltoid, and triceps. Full active ROM of right shoulder, elbow, forearm, wrist, and hand with 5/5 strength. Sensation grossly intact. 2+ radial pulse. Cap refill <2. No swelling bruising erythema, or rash present. No midline C/T/L tenderness.  Neurological: She is alert and oriented to person, place, and time.  Skin: Skin is warm and dry. Capillary refill takes less than 2 seconds.  Nursing note and vitals reviewed.    ED Treatments / Results   DIAGNOSTIC STUDIES: Oxygen Saturation is 99% on room air, normal by my interpretation.    COORDINATION OF CARE: 5:51 PM Discussed treatment plan with pt at bedside and pt agreed to plan, which include XR of right shoulder, follow up with orthopedist.  Labs (all labs ordered are listed, but only abnormal results are displayed) Labs Reviewed - No data to display  EKG  EKG Interpretation None       Radiology Dg Shoulder Right  Result Date: 03/12/2016 CLINICAL DATA:  Right shoulder pain after lifting child. EXAM: RIGHT SHOULDER - 2+ VIEW COMPARISON:  None FINDINGS: There is no fracture or dislocation of the right shoulder. The acromioclavicular and glenohumeral joints are normal. IMPRESSION: No fracture or dislocation of the right shoulder. Electronically Signed   By: Deatra Robinson M.D.   On: 03/12/2016 18:31    Procedures Procedures (including critical care time)  Medications Ordered in ED Medications  ketorolac (TORADOL) 30 MG/ML injection 30 mg (30 mg Intramuscular Given 03/12/16 1852)      Initial Impression / Assessment and Plan / ED Course  I have reviewed the triage vital signs and the nursing notes.  Pertinent labs & imaging results that were available during my care of the patient were reviewed by me and considered in my medical decision making (see chart for details).     Patient presents with right shoulder pain that started after picking up her knee's yesterday evening. Pain is worse with movement. Denies any other recent fall, trauma or injury. Denies prior injury to right shoulder, numbness or weakness. VSS. Exam revealed tenderness to right lateral trapezius, deltoid and triceps. Patient with full active range of motion of right shoulder and upper extremity with 5 out of 5 strength. Bilateral upper extremities neurovascularly intact. No midline spinal tenderness. Right shoulder x-ray negative. Suspect patient's symptoms are likely due to muscle strain. Plan to discharge patient  home with NSAIDs and cryotherapy. Patient given information to follow-up with orthopedics if symptoms have not improved over the next week due to possibility of ligament injury. Discussed return precautions.should  Final Clinical Impressions(s) / ED Diagnoses   Final diagnoses:  Acute pain of right shoulder    New Prescriptions New Prescriptions   NAPROXEN (NAPROSYN) 500 MG TABLET    Take 1 tablet (500 mg total) by mouth 2 (two) times daily.   I personally performed the services described in this documentation, which was scribed in my presence. The recorded information has been reviewed and is accurate.     Satira Sark [place], [place] 03/12/16 1853    Vanetta Mulders, MD 03/12/16 [phone]

## 2016-03-12 NOTE — Discharge Instructions (Signed)
Take your medication as prescribed as needed for pain relief. I also recommend continuing to apply ice to affected area for 15-20 minutes 3-4 times daily. Refrain from doing any heavy lifting or repetitive movements for the next few days and see her symptoms improved. Follow-up with the orthopedic clinic listed below if your symptoms have not improved over the next week. Return to the emergency department if symptoms worsen or new onset of fever, redness, swelling, numbness, weakness.

## 2016-03-12 NOTE — ED Triage Notes (Signed)
Right arm pain since this am. Sharp pain at her shoulder.

## 2016-11-04 ENCOUNTER — Emergency Department (HOSPITAL_BASED_OUTPATIENT_CLINIC_OR_DEPARTMENT_OTHER): Payer: Self-pay

## 2016-11-04 ENCOUNTER — Emergency Department (HOSPITAL_BASED_OUTPATIENT_CLINIC_OR_DEPARTMENT_OTHER)
Admission: EM | Admit: 2016-11-04 | Discharge: 2016-11-04 | Disposition: A | Discharge status: Home / Self Care | Payer: Self-pay | Attending: Emergency Medicine | Admitting: Emergency Medicine

## 2016-11-04 ENCOUNTER — Encounter (HOSPITAL_BASED_OUTPATIENT_CLINIC_OR_DEPARTMENT_OTHER): Payer: Self-pay | Admitting: *Deleted

## 2016-11-04 DIAGNOSIS — S93509A Unspecified sprain of unspecified toe(s), initial encounter: Secondary | ICD-10-CM

## 2016-11-04 DIAGNOSIS — Y929 Unspecified place or not applicable: Secondary | ICD-10-CM | POA: Insufficient documentation

## 2016-11-04 DIAGNOSIS — S93504A Unspecified sprain of right lesser toe(s), initial encounter: Secondary | ICD-10-CM | POA: Insufficient documentation

## 2016-11-04 DIAGNOSIS — Z79899 Other long term (current) drug therapy: Secondary | ICD-10-CM | POA: Insufficient documentation

## 2016-11-04 DIAGNOSIS — Y939 Activity, unspecified: Secondary | ICD-10-CM | POA: Insufficient documentation

## 2016-11-04 DIAGNOSIS — W2203XA Walked into furniture, initial encounter: Secondary | ICD-10-CM | POA: Insufficient documentation

## 2016-11-04 DIAGNOSIS — Y999 Unspecified external cause status: Secondary | ICD-10-CM | POA: Insufficient documentation

## 2016-11-04 DIAGNOSIS — J45909 Unspecified asthma, uncomplicated: Secondary | ICD-10-CM | POA: Insufficient documentation

## 2016-11-04 DIAGNOSIS — G35 Multiple sclerosis: Secondary | ICD-10-CM | POA: Insufficient documentation

## 2016-11-04 NOTE — Discharge Instructions (Signed)
May buddy tape your toes together if it provides relief.  Please take Ibuprofen (Advil, motrin) and Tylenol (acetaminophen) to relieve your pain.  You may take up to 600 MG (3 pills) of normal strength ibuprofen every 8 hours as needed.  In between doses of ibuprofen you make take tylenol, up to 1,000 mg (two extra strength pills).  Do not take more than 3,000 mg tylenol in a 24 hour period.  Please check all medication labels as many medications such as pain and cold medications may contain tylenol.  Do not drink alcohol while taking these medications.  Do not take other NSAID'S while taking ibuprofen (such as aleve or naproxen).  Please take ibuprofen with food to decrease stomach upset.

## 2016-11-04 NOTE — ED Triage Notes (Signed)
She hit her right 4th and 5th toes on the dresser 2 days ago.

## 2016-11-06 NOTE — ED Provider Notes (Signed)
[facility] EMERGENCY DEPARTMENT Provider Note   CSN: [id number] Arrival date & time: 11/04/16  1602     History   Chief Complaint Chief Complaint  Patient presents with  . Toe Injury    HPI Michele Marshall is a 36 y.o. female with a history of celiac disease, fibromyalgia, multiple sclerosis, who presents today for evaluation of 2 days of right fourth and fifth toe pain.  She reports that she hit her toes on a dresser which caused a sudden onset of pain.  She is concerned that they may be broken.  She denies any wounds on her toes.  She has tried buddy taping her toes which provided mild relief. HPI  Past Medical History:  Diagnosis Date  . Asthma   . Celiac disease   . Fibromyalgia   . IBS (irritable bowel syndrome)   . Multiple sclerosis (HCC)   . Scoliosis     There are no active problems to display for this patient.   History reviewed. No pertinent surgical history.  OB History    No data available       Home Medications    Prior to Admission medications   Medication Sig Start Date End Date Taking? Authorizing Provider  acetaminophen (TYLENOL) 325 MG tablet Take 650 mg by mouth every 6 (six) hours as needed. pain    [provider]  ALPHA LIPOIC ACID PO Take 1 tablet by mouth 2 (two) times daily.      [provider]  Chaste Tree 20 MG TABS Take 1 tablet by mouth 2 (two) times daily.      [provider]  cholecalciferol (VITAMIN D) 1000 UNITS tablet Take 1,000 Units by mouth daily.      [provider]  Coenzyme Q10 (CO Q 10) 100 MG CAPS Take 1 tablet by mouth daily.      [provider]  diazepam (VALIUM) 5 MG tablet Take 1 tablet (5 mg total) by mouth every 8 (eight) hours as needed for muscle spasms. 11/17/14   Little, Ambrose Finland, MD  Evening Primrose Oil 500 MG CAPS Take 2 capsules by mouth daily.      [provider]  ibuprofen (ADVIL,MOTRIN) 200 MG tablet Take 800 mg by mouth every 6 (six)  hours as needed. pain    [provider]  IODINE, KELP, PO Take 1 tablet by mouth daily.      [provider]  magnesium gluconate (MAGONATE) 500 MG tablet Take 1,500 mg by mouth daily.      [provider]  Multiple Vitamin (MULTIVITAMIN) tablet Take 1 tablet by mouth daily.      [provider]  naproxen (NAPROSYN) 500 MG tablet Take 1 tablet (500 mg total) by mouth 2 (two) times daily. 03/12/16   Barrett Henle, PA-C    Family History No family history on file.  Social History Social History  Substance Use Topics  . Smoking status: Never Smoker  . Smokeless tobacco: Never Used  . Alcohol use Yes     Allergies   Penicillins; Sulfa antibiotics; Dayquil [pseudoephedrine-dm-gg-apap]; Diphenhydramine hcl; Gluten; Lactulose; Morphine and related; Nyquil [pseudoeph-doxylamine-dm-apap]; Other; and Zofran   Review of Systems Review of Systems  Musculoskeletal: Positive for arthralgias.  Skin: Negative for color change, rash and wound.     Physical Exam Updated Vital Signs BP (!) 145/96   Pulse (!) 58   Temp 98.2 F (36.8 C) (Oral)   Resp 18   Ht 5'  5" (1.651 m)   Wt 72.6 kg (160 lb)   LMP 10/07/2016   SpO2 100%   BMI 26.63 kg/m   Physical Exam  Constitutional: She appears well-developed and well-nourished.  HENT:  Head: Normocephalic.  Cardiovascular: Intact distal pulses.   Bilateral feet and toes are warm and well perfused.  Musculoskeletal:  Tenderness to palpation, and very mild ecchymosis along right fourth and fifth toes.  There is no obvious tenderness to the remainder of the foot.  No obvious deformities.  Neurological:  Sensation intact to bilateral feet.  Skin: Skin is warm and dry. She is not diaphoretic.  Skin is intact to bilateral feet.  There are no obvious subungual hematoma.  Psychiatric: She has a normal mood and affect.  Nursing note and vitals reviewed.    ED Treatments / Results  Labs (all labs  ordered are listed, but only abnormal results are displayed) Labs Reviewed - No data to display  EKG  EKG Interpretation None       Radiology No results found.  CLINICAL DATA: Injury to right 4th and 5th toes 2 nights ago, hit  toes on furniture. No previous injury.    EXAM:  RIGHT FOOT COMPLETE - 3+ VIEW    COMPARISON: None.    FINDINGS:  There is no evidence of fracture or dislocation. There is no  evidence of arthropathy or other focal bone abnormality. Soft  tissues are unremarkable.    IMPRESSION:  Negative.      Electronically Signed  By: Bary RichardStan Maynard M.D.  On: 11/04/2016 16:38     Procedures Procedures (including critical care time)  Medications Ordered in ED Medications - No data to display   Initial Impression / Assessment and Plan / ED Course  I have reviewed the triage vital signs and the nursing notes.  Pertinent labs & imaging results that were available during my care of the patient were reviewed by me and considered in my medical decision making (see chart for details).    Michele Marshall presents today for evaluation of pain in her right fourth and fifth toes after stubbing them 2 days ago.  X-rays were obtained, negative for acute abnormalities.  No subungual hematoma requiring drainage.  No obvious wounds to her foot.  Follow-up with PCP as needed.  Over-the-counter pain control.   Final Clinical Impressions(s) / ED Diagnoses   Final diagnoses:  Sprain of toe, initial encounter    New Prescriptions Discharge Medication List as of 11/04/2016  6:26 PM       Michele Marshall, Michele Zipp W, PA-C 11/06/16 2032    Michele Marshall, Jon, MD 11/07/16 [phone]

## 2016-11-27 ENCOUNTER — Emergency Department (HOSPITAL_BASED_OUTPATIENT_CLINIC_OR_DEPARTMENT_OTHER)
Admission: EM | Admit: 2016-11-27 | Discharge: 2016-11-28 | Disposition: A | Discharge status: Home / Self Care | Payer: Self-pay | Attending: Emergency Medicine | Admitting: Emergency Medicine

## 2016-11-27 ENCOUNTER — Other Ambulatory Visit: Payer: Self-pay

## 2016-11-27 ENCOUNTER — Encounter (HOSPITAL_BASED_OUTPATIENT_CLINIC_OR_DEPARTMENT_OTHER): Payer: Self-pay | Admitting: *Deleted

## 2016-11-27 DIAGNOSIS — Y999 Unspecified external cause status: Secondary | ICD-10-CM | POA: Insufficient documentation

## 2016-11-27 DIAGNOSIS — W2209XA Striking against other stationary object, initial encounter: Secondary | ICD-10-CM | POA: Insufficient documentation

## 2016-11-27 DIAGNOSIS — S060X0A Concussion without loss of consciousness, initial encounter: Secondary | ICD-10-CM | POA: Insufficient documentation

## 2016-11-27 DIAGNOSIS — Y929 Unspecified place or not applicable: Secondary | ICD-10-CM | POA: Insufficient documentation

## 2016-11-27 DIAGNOSIS — G43801 Other migraine, not intractable, with status migrainosus: Secondary | ICD-10-CM | POA: Insufficient documentation

## 2016-11-27 DIAGNOSIS — Y939 Activity, unspecified: Secondary | ICD-10-CM | POA: Insufficient documentation

## 2016-11-27 DIAGNOSIS — J45909 Unspecified asthma, uncomplicated: Secondary | ICD-10-CM | POA: Insufficient documentation

## 2016-11-27 DIAGNOSIS — Z79899 Other long term (current) drug therapy: Secondary | ICD-10-CM | POA: Insufficient documentation

## 2016-11-27 NOTE — ED Triage Notes (Signed)
Pt c/o head injury x 3 days ago, no LOC, c/o left eye pain and pressure

## 2016-11-28 ENCOUNTER — Emergency Department (HOSPITAL_BASED_OUTPATIENT_CLINIC_OR_DEPARTMENT_OTHER): Payer: Self-pay

## 2016-11-28 LAB — PREGNANCY, URINE: Preg Test, Ur: NEGATIVE

## 2016-11-28 MED ORDER — TRAMADOL HCL 50 MG PO TABS: 50.0000 mg | ORAL_TABLET | Freq: Four times a day (QID) | ORAL | 0 refills | Status: AC | PRN

## 2016-11-28 MED ORDER — IBUPROFEN 800 MG PO TABS: 800.0000 mg | ORAL_TABLET | Freq: Three times a day (TID) | ORAL | 0 refills | Status: AC

## 2016-11-28 MED ORDER — PROCHLORPERAZINE EDISYLATE 5 MG/ML IJ SOLN
10.0000 mg | Freq: Once | INTRAMUSCULAR | Status: AC
  Administered 2016-11-28: 10 mg via INTRAVENOUS
  Filled 2016-11-28: qty 2

## 2016-11-28 MED ORDER — KETOROLAC TROMETHAMINE 30 MG/ML IJ SOLN
30.0000 mg | Freq: Once | INTRAMUSCULAR | Status: AC
  Administered 2016-11-28: 30 mg via INTRAVENOUS
  Filled 2016-11-28: qty 1

## 2016-11-28 NOTE — ED Provider Notes (Signed)
[facility] EMERGENCY DEPARTMENT Provider Note   CSN: [id number] Arrival date & time: 11/27/16  2348     History   Chief Complaint Chief Complaint  Patient presents with  . Head Injury    HPI Michele Marshall is a 36 y.o. female.  Patient presents to the ER for evaluation of head injury.  Patient reports that she struck the back of her head 3 days ago.  She reports that she was bending over, stood up fast and hit her head on a cabinet.  She did not get knocked out, but has been experiencing persistent headache ever since.  Patient complaining of severe pain on the left side of her head, around her left eye with light sensitivity.  She has a history of migraines as a child, but has not had any in over 10 years.  No neck pain.  No fever.      Past Medical History:  Diagnosis Date  . Asthma   . Celiac disease   . Fibromyalgia   . IBS (irritable bowel syndrome)   . Multiple sclerosis (HCC)   . Scoliosis     There are no active problems to display for this patient.   History reviewed. No pertinent surgical history.  OB History    No data available       Home Medications    Prior to Admission medications   Medication Sig Start Date End Date Taking? Authorizing Provider  acetaminophen (TYLENOL) 325 MG tablet Take 650 mg by mouth every 6 (six) hours as needed. pain    [provider]  ALPHA LIPOIC ACID PO Take 1 tablet by mouth 2 (two) times daily.      [provider]  Chaste Tree 20 MG TABS Take 1 tablet by mouth 2 (two) times daily.      [provider]  cholecalciferol (VITAMIN D) 1000 UNITS tablet Take 1,000 Units by mouth daily.      [provider]  Coenzyme Q10 (CO Q 10) 100 MG CAPS Take 1 tablet by mouth daily.      [provider]  Evening Primrose Oil 500 MG CAPS Take 2 capsules by mouth daily.      [provider]  ibuprofen (ADVIL,MOTRIN) 200 MG tablet Take 800 mg by mouth every 6 (six) hours as  needed. pain    [provider]  ibuprofen (ADVIL,MOTRIN) 800 MG tablet Take 1 tablet (800 mg total) 3 (three) times daily by mouth. 11/28/16   Kabrea Seeney, Canary Brim, MD  IODINE, KELP, PO Take 1 tablet by mouth daily.      [provider]  magnesium gluconate (MAGONATE) 500 MG tablet Take 1,500 mg by mouth daily.      [provider]  Multiple Vitamin (MULTIVITAMIN) tablet Take 1 tablet by mouth daily.      [provider]  naproxen (NAPROSYN) 500 MG tablet Take 1 tablet (500 mg total) by mouth 2 (two) times daily. 03/12/16   Barrett Henle, PA-C  traMADol (ULTRAM) 50 MG tablet Take 1 tablet (50 mg total) every 6 (six) hours as needed by mouth. 11/28/16   Eitan Doubleday, Canary Brim, MD    Family History History reviewed. No pertinent family history.  Social History Social History   Tobacco Use  . Smoking status: Never Smoker  . Smokeless tobacco: Never Used  Substance Use Topics  . Alcohol use: Yes  . Drug use: No     Allergies   Penicillins; Sulfa antibiotics; Dayquil [  pseudoephedrine-dm-gg-apap]; Diphenhydramine hcl; Gluten; Lactulose; Morphine and related; Nyquil [pseudoeph-doxylamine-dm-apap]; Other; and Zofran   Review of Systems Review of Systems  Eyes: Positive for photophobia.  Neurological: Positive for headaches.  All other systems reviewed and are negative.    Physical Exam Updated Vital Signs BP (!) 150/98 (BP Location: Right Arm)   Pulse 63   Temp 98.4 F (36.9 C) (Oral)   Resp 16   Ht 5\' 5"  (1.651 m)   Wt 73.9 kg (163 lb)   LMP 11/05/2016 Comment: (-) urimne pregnancy//a.c.  SpO2 99%   BMI 27.12 kg/m   Physical Exam  Constitutional: She is oriented to person, place, and time. She appears well-developed and well-nourished. No distress.  HENT:  Head: Normocephalic and atraumatic.  Right Ear: Hearing normal.  Left Ear: Hearing normal.  Nose: Nose normal.  Mouth/Throat: Oropharynx is clear and moist and  mucous membranes are normal.  Eyes: Conjunctivae and EOM are normal. Pupils are equal, round, and reactive to light.  Bilateral photophobia, normal pupillary response to light  Neck: Normal range of motion. Neck supple.  Cardiovascular: Regular rhythm, S1 normal and S2 normal. Exam reveals no gallop and no friction rub.  No murmur heard. Pulmonary/Chest: Effort normal and breath sounds normal. No respiratory distress. She exhibits no tenderness.  Abdominal: Soft. Normal appearance and bowel sounds are normal. There is no hepatosplenomegaly. There is no tenderness. There is no rebound, no guarding, no tenderness at McBurney's point and negative Murphy's sign. No hernia.  Musculoskeletal: Normal range of motion.  Neurological: She is alert and oriented to person, place, and time. She has normal strength. No cranial nerve deficit or sensory deficit. Coordination normal. GCS eye subscore is 4. GCS verbal subscore is 5. GCS motor subscore is 6.  Extraocular muscle movement: normal No visual field cut Pupils: equal and reactive both direct and consensual response is normal No nystagmus present    Sensory function is intact to light touch, pinprick Proprioception intact  Grip strength 5/5 symmetric in upper extremities No pronator drift Normal finger to nose bilaterally  Lower extremity strength 5/5 against gravity Normal heel to shin bilaterally  Gait: normal   Skin: Skin is warm, dry and intact. No rash noted. No cyanosis.  Psychiatric: She has a normal mood and affect. Her speech is normal and behavior is normal. Thought content normal.  Nursing note and vitals reviewed.    ED Treatments / Results  Labs (all labs ordered are listed, but only abnormal results are displayed) Labs Reviewed  PREGNANCY, URINE    EKG  EKG Interpretation None       Radiology Ct Head Wo Contrast  Result Date: 11/28/2016 CLINICAL DATA:  Acute onset of posttraumatic headache. EXAM: CT HEAD  WITHOUT CONTRAST TECHNIQUE: Contiguous axial images were obtained from the base of the skull through the vertex without intravenous contrast. COMPARISON:  MRI of the brain performed 08/28/2005 FINDINGS: Brain: No evidence of acute infarction, hemorrhage, hydrocephalus, extra-axial collection or mass lesion/mass effect. The posterior fossa, including the cerebellum, brainstem and fourth ventricle, is within normal limits. The third and lateral ventricles, and basal ganglia are unremarkable in appearance. The cerebral hemispheres are symmetric in appearance, with normal gray-white differentiation. No mass effect or midline shift is seen. Vascular: No hyperdense vessel or unexpected calcification. Skull: There is no evidence of fracture; visualized osseous structures are unremarkable in appearance. Sinuses/Orbits: The visualized portions of the orbits are within normal limits. The paranasal sinuses and mastoid air cells are well-aerated. Other: No  significant soft tissue abnormalities are seen. IMPRESSION: Unremarkable noncontrast CT of the head. Electronically Signed   By: Roanna RaiderJeffery  Chang M.D.   On: 11/28/2016 02:23    Procedures Procedures (including critical care time)  Medications Ordered in ED Medications  ketorolac (TORADOL) 30 MG/ML injection 30 mg (30 mg Intravenous Given 11/28/16 0248)  prochlorperazine (COMPAZINE) injection 10 mg (10 mg Intravenous Given 11/28/16 0252)     Initial Impression / Assessment and Plan / ED Course  I have reviewed the triage vital signs and the nursing notes.  Pertinent labs & imaging results that were available during my care of the patient were reviewed by me and considered in my medical decision making (see chart for details).     Patient presents to the ER for evaluation of headache.  Patient hit her head 3 days ago and has had persistent headache ever since.  It sounds like this started as a normal headache, but has now transformed into more of a migraine  type headache with light sensitivity.  She does have a history of migraines, but not for many years.  Head CT performed because of the trauma, no evidence of abnormality noted.  Patient treated as a migraine headache with improvement.  Final Clinical Impressions(s) / ED Diagnoses   Final diagnoses:  Concussion without loss of consciousness, initial encounter  Other migraine with status migrainosus, not intractable    ED Discharge Orders        Ordered    traMADol (ULTRAM) 50 MG tablet  Every 6 hours PRN     11/28/16 0355    ibuprofen (ADVIL,MOTRIN) 800 MG tablet  3 times daily     11/28/16 0355       Gilda CreasePollina, Alie Moudy J, MD 11/28/16 [phone]

## 2018-02-13 ENCOUNTER — Emergency Department (HOSPITAL_BASED_OUTPATIENT_CLINIC_OR_DEPARTMENT_OTHER): Payer: Self-pay

## 2018-02-13 ENCOUNTER — Other Ambulatory Visit: Payer: Self-pay

## 2018-02-13 ENCOUNTER — Emergency Department (HOSPITAL_BASED_OUTPATIENT_CLINIC_OR_DEPARTMENT_OTHER)
Admission: EM | Admit: 2018-02-13 | Discharge: 2018-02-13 | Disposition: A | Discharge status: Home / Self Care | Payer: Self-pay | Attending: Emergency Medicine | Admitting: Emergency Medicine

## 2018-02-13 ENCOUNTER — Encounter (HOSPITAL_BASED_OUTPATIENT_CLINIC_OR_DEPARTMENT_OTHER): Payer: Self-pay | Admitting: *Deleted

## 2018-02-13 DIAGNOSIS — Y92007 Garden or yard of unspecified non-institutional (private) residence as the place of occurrence of the external cause: Secondary | ICD-10-CM | POA: Insufficient documentation

## 2018-02-13 DIAGNOSIS — W19XXXA Unspecified fall, initial encounter: Secondary | ICD-10-CM

## 2018-02-13 DIAGNOSIS — Y9301 Activity, walking, marching and hiking: Secondary | ICD-10-CM | POA: Insufficient documentation

## 2018-02-13 DIAGNOSIS — S4992XA Unspecified injury of left shoulder and upper arm, initial encounter: Secondary | ICD-10-CM | POA: Insufficient documentation

## 2018-02-13 DIAGNOSIS — J45909 Unspecified asthma, uncomplicated: Secondary | ICD-10-CM | POA: Insufficient documentation

## 2018-02-13 DIAGNOSIS — Y999 Unspecified external cause status: Secondary | ICD-10-CM | POA: Insufficient documentation

## 2018-02-13 DIAGNOSIS — W010XXA Fall on same level from slipping, tripping and stumbling without subsequent striking against object, initial encounter: Secondary | ICD-10-CM | POA: Insufficient documentation

## 2018-02-13 NOTE — Discharge Instructions (Signed)

## 2018-02-13 NOTE — ED Triage Notes (Signed)
Pt c/o left arm injury x 1 week

## 2018-02-13 NOTE — ED Provider Notes (Signed)
Emergency Department Provider Note   I have reviewed the triage vital signs and the nursing notes.   HISTORY  Chief Complaint Arm Injury   HPI Michele Marshall is a 38 y.o. female with PMH of asthma and IBS presents to the emergency department with left upper arm pain after injury 1 week prior.  Patient states she was walking on wet leaves when she fell onto her outstretched hand.  She reports hearing a popping sound and had pain in the left upper arm.  No sore tingling daily afterwards but has had some occasional tingling in her fingers.  She noticed some pale appearance over the left fifth and fourth digits yesterday. Pain is primary from the left shoulder reviewed the left elbow.  She denies any chest pain or shortness of breath.  No wrist pain.  No pain in other areas.   Past Medical History:  Diagnosis Date  . Asthma   . Celiac disease   . Fibromyalgia   . IBS (irritable bowel syndrome)   . Multiple sclerosis (HCC)   . Scoliosis     There are no active problems to display for this patient.   History reviewed. No pertinent surgical history.   Allergies Penicillins; Sulfa antibiotics; Dayquil [pseudoephedrine-dm-gg-apap]; Diphenhydramine hcl; Gluten; Lactulose; Morphine and related; Nyquil [pseudoeph-doxylamine-dm-apap]; Other; and Zofran  History reviewed. No pertinent family history.  Social History Social History   Tobacco Use  . Smoking status: Never Smoker  . Smokeless tobacco: Never Used  Substance Use Topics  . Alcohol use: Yes  . Drug use: No    Review of Systems  Eyes: No visual changes. Cardiovascular: Denies chest pain. Respiratory: Denies shortness of breath. Genitourinary: Negative for dysuria. Musculoskeletal: Negative for back pain. Positive left arm pain.   10-point ROS otherwise negative.  ____________________________________________   PHYSICAL EXAM:  VITAL SIGNS: ED Triage Vitals [02/13/18 1504]  Enc Vitals Group     BP (!)  144/99     Pulse Rate 86     Resp 18     Temp 98.2 F (36.8 C)     Temp Source Oral     SpO2 100 %     Weight 160 lb (72.6 kg)     Height 5\' 4"  (1.626 m)     Pain Score 7   Constitutional: Alert and oriented. Well appearing and in no acute distress. Eyes: Conjunctivae are normal.  Head: Atraumatic. Nose: No congestion/rhinnorhea. Mouth/Throat: Mucous membranes are moist.  Neck: No stridor.   Cardiovascular: Normal rate, regular rhythm. Good peripheral circulation. Grossly normal heart sounds.   Respiratory: Normal respiratory effort.  No retractions. Lungs CTAB. Gastrointestinal: No distention.  Musculoskeletal: Tenderness to palpation over the left elbow, bicep, and shoulder on the left. No forearm or wrist tenderness. No snuff box or scaphoid tenderness. No appreciable arm swelling.  Neurologic:  Normal speech and language. No gross focal neurologic deficits are appreciated.  Skin:  Skin is warm, dry and intact.   ____________________________________________  RADIOLOGY  Dg Elbow Complete Left  Result Date: 02/13/2018 CLINICAL DATA:  Left elbow pain and hand paresthesias and discoloration following a fall 1 week ago. EXAM: LEFT ELBOW - COMPLETE 3+ VIEW COMPARISON:  None. FINDINGS: There is no evidence of fracture, dislocation, or joint effusion. There is no evidence of arthropathy or other focal bone abnormality. Soft tissues are unremarkable. IMPRESSION: Normal examination. Electronically Signed   By: Beckie Salts M.D.   On: 02/13/2018 15:44   Dg Shoulder Left  Result Date:  02/13/2018 CLINICAL DATA:  Left shoulder pain and left hand paresthesias and discoloration following a fall 1 week ago. EXAM: LEFT SHOULDER - 2+ VIEW COMPARISON:  None. FINDINGS: There is no evidence of fracture or dislocation. There is no evidence of arthropathy or other focal bone abnormality. Soft tissues are unremarkable. IMPRESSION: Normal examination. Electronically Signed   By: Beckie Salts M.D.   On:  02/13/2018 15:45    ____________________________________________   PROCEDURES  Procedure(s) performed:   Procedures  None ____________________________________________   INITIAL IMPRESSION / ASSESSMENT AND PLAN / ED COURSE  Pertinent labs & imaging results that were available during my care of the patient were reviewed by me and considered in my medical decision making (see chart for details).  Patient presents to the emergency department for evaluation of pain.  Extremely low suspicion for upper extremity DVT.  Patient has no erythema or overlying cellulitis.  Normal range of motion of the shoulder and elbow.  Suspect primarily soft tissue injury but given 1 week of symptoms plan for plain film of the left shoulder and left elbow.  The patient's upper extremity is neurovascularly intact here.   03:45 PM Plain films reviewed with no acute bony abnormalities.  Advise continue ACE wrap and warm compress. Continue Tylenol/Motrin PRN pain. Provided contact information for sports medicine for follow up if symptoms continue. Discussed the cons of joint immobilization such as sling and risk for adhesive capsulitis. Discussed ED return precautions in detail.  ____________________________________________  FINAL CLINICAL IMPRESSION(S) / ED DIAGNOSES  Final diagnoses:  Injury of left upper extremity, initial encounter  Fall, initial encounter    Note:  This document was prepared using Dragon voice recognition software and may include unintentional dictation errors.  Alona Bene, MD Emergency Medicine    Long, Arlyss Repress, MD 02/13/18 [phone]

## 2018-09-17 ENCOUNTER — Emergency Department (HOSPITAL_BASED_OUTPATIENT_CLINIC_OR_DEPARTMENT_OTHER): Payer: Medicaid Other

## 2018-09-17 ENCOUNTER — Encounter (HOSPITAL_BASED_OUTPATIENT_CLINIC_OR_DEPARTMENT_OTHER): Payer: Self-pay | Admitting: Emergency Medicine

## 2018-09-17 ENCOUNTER — Other Ambulatory Visit: Payer: Self-pay

## 2018-09-17 ENCOUNTER — Emergency Department (HOSPITAL_BASED_OUTPATIENT_CLINIC_OR_DEPARTMENT_OTHER)
Admission: EM | Admit: 2018-09-17 | Discharge: 2018-09-17 | Disposition: A | Discharge status: Home / Self Care | Payer: Medicaid Other | Attending: Emergency Medicine | Admitting: Emergency Medicine

## 2018-09-17 DIAGNOSIS — Z79899 Other long term (current) drug therapy: Secondary | ICD-10-CM | POA: Insufficient documentation

## 2018-09-17 DIAGNOSIS — Y939 Activity, unspecified: Secondary | ICD-10-CM | POA: Insufficient documentation

## 2018-09-17 DIAGNOSIS — Y999 Unspecified external cause status: Secondary | ICD-10-CM | POA: Insufficient documentation

## 2018-09-17 DIAGNOSIS — S93401A Sprain of unspecified ligament of right ankle, initial encounter: Secondary | ICD-10-CM | POA: Insufficient documentation

## 2018-09-17 DIAGNOSIS — G35 Multiple sclerosis: Secondary | ICD-10-CM | POA: Insufficient documentation

## 2018-09-17 DIAGNOSIS — Y929 Unspecified place or not applicable: Secondary | ICD-10-CM | POA: Insufficient documentation

## 2018-09-17 DIAGNOSIS — K900 Celiac disease: Secondary | ICD-10-CM | POA: Insufficient documentation

## 2018-09-17 DIAGNOSIS — S93491A Sprain of other ligament of right ankle, initial encounter: Secondary | ICD-10-CM

## 2018-09-17 DIAGNOSIS — W231XXA Caught, crushed, jammed, or pinched between stationary objects, initial encounter: Secondary | ICD-10-CM | POA: Insufficient documentation

## 2018-09-17 DIAGNOSIS — J45909 Unspecified asthma, uncomplicated: Secondary | ICD-10-CM | POA: Insufficient documentation

## 2018-09-17 MED ORDER — NAPROXEN 250 MG PO TABS
500.0000 mg | ORAL_TABLET | Freq: Once | ORAL | Status: AC
  Administered 2018-09-17: 15:00:00 500 mg via ORAL
  Filled 2018-09-17: qty 2

## 2018-09-17 NOTE — ED Notes (Signed)
Pt in wheelchair, awaiting xray

## 2018-09-17 NOTE — ED Notes (Signed)
Pt reports 1g tylenol and 800 mg ibuprofen 2.5 hrs pta. Reports mild relief, pain returning

## 2018-09-17 NOTE — ED Notes (Signed)
EMT applying ace wrap and teaching crutch use

## 2018-09-17 NOTE — Discharge Instructions (Signed)
Thank you for allowing me to care for you today. Please return to the emergency department if you have new or worsening symptoms. Take your medications as instructed.  Take motrin or naproxen for pain and swelling

## 2018-09-17 NOTE — ED Provider Notes (Signed)
[facility] EMERGENCY DEPARTMENT Provider Note   CSN: [id number] Arrival date & time: 09/17/18  1410     History   Chief Complaint Chief Complaint  Patient presents with   Foot Injury    HPI Michele Marshall is a 38 y.o. female.     Patient is a 38 year old female with past medical history of asthma, fibromyalgia, IBS, celiac's disease, multiple sclerosis who presents to the emergency department for right ankle pain.  Patient reports that this past Sunday she was sitting on the floor with her right knee bent when her knees fell on top of her foot.  This caused her foot and ankle to become inverted.  Reports that she had sudden pain in the lateral side of her foot and ankle.  Reports that the pain seemed to worsen over the last couple of days.  She has been able to tolerate very minimal to no weight on the foot and is using a cane for ambulation.  Reports pain, swelling and bruising.  Has not tried anything for relief.     Past Medical History:  Diagnosis Date   Asthma    Celiac disease    Fibromyalgia    IBS (irritable bowel syndrome)    Multiple sclerosis (HCC)    Scoliosis     There are no active problems to display for this patient.   History reviewed. No pertinent surgical history.   OB History   No obstetric history on file.      Home Medications    Prior to Admission medications   Medication Sig Start Date End Date Taking? Authorizing Provider  acetaminophen (TYLENOL) 325 MG tablet Take 650 mg by mouth every 6 (six) hours as needed. pain    [provider]  ALPHA LIPOIC ACID PO Take 1 tablet by mouth 2 (two) times daily.      [provider]  Chaste Tree 20 MG TABS Take 1 tablet by mouth 2 (two) times daily.      [provider]  cholecalciferol (VITAMIN D) 1000 UNITS tablet Take 1,000 Units by mouth daily.      [provider]  Coenzyme Q10 (CO Q 10) 100 MG CAPS Take 1 tablet by mouth daily.      [provider]  Evening Primrose Oil 500 MG CAPS Take 2 capsules by mouth daily.      [provider]  ibuprofen (ADVIL,MOTRIN) 200 MG tablet Take 800 mg by mouth every 6 (six) hours as needed. pain    [provider]  ibuprofen (ADVIL,MOTRIN) 800 MG tablet Take 1 tablet (800 mg total) 3 (three) times daily by mouth. 11/28/16   Pollina, Canary Brim, MD  IODINE, KELP, PO Take 1 tablet by mouth daily.      [provider]  magnesium gluconate (MAGONATE) 500 MG tablet Take 1,500 mg by mouth daily.      [provider]  Multiple Vitamin (MULTIVITAMIN) tablet Take 1 tablet by mouth daily.      [provider]  naproxen (NAPROSYN) 500 MG tablet Take 1 tablet (500 mg total) by mouth 2 (two) times daily. 03/12/16   Barrett Henle, PA-C  traMADol (ULTRAM) 50 MG tablet Take 1 tablet (50 mg total) every 6 (six) hours as needed by mouth. 11/28/16   Pollina, Canary Brim, MD    Family History No family history on file.  Social History Social History   Tobacco Use   Smoking status: Never Smoker   Smokeless  tobacco: Never Used  Substance Use Topics   Alcohol use: Yes   Drug use: No     Allergies   Penicillins, Sulfa antibiotics, Dayquil [pseudoephedrine-dm-gg-apap], Diphenhydramine hcl, Gluten, Lactulose, Morphine and related, Nyquil [pseudoeph-doxylamine-dm-apap], Other, and Zofran   Review of Systems Review of Systems  Constitutional: Negative for fever.  Musculoskeletal: Positive for arthralgias, gait problem and joint swelling. Negative for myalgias.  Skin: Negative for rash and wound.  Neurological: Negative for numbness.  All other systems reviewed and are negative.    Physical Exam Updated Vital Signs BP (!) 157/114 (BP Location: Right Arm)    Pulse 87    Temp 99 F (37.2 C) (Oral)    Resp 16    Ht 5\' 5"  (1.651 m)    Wt 74.8 kg    LMP 08/17/2018    SpO2 98%    BMI 27.46 kg/m   Physical Exam Vitals signs and nursing  note reviewed.  Constitutional:      Appearance: Normal appearance.  HENT:     Head: Normocephalic.  Eyes:     Conjunctiva/sclera: Conjunctivae normal.  Pulmonary:     Effort: Pulmonary effort is normal.  Musculoskeletal:     Right ankle: She exhibits decreased range of motion and swelling. She exhibits no ecchymosis, no deformity, no laceration and normal pulse. Tenderness. AITFL tenderness found. No lateral malleolus and no medial malleolus tenderness found. Achilles tendon exhibits no pain, no defect and normal Thompson's test results.     Comments: Mild swelling to the lateral right foot and ankle.  Tenderness to palpation over the lateral foot.  She has pain with plantar flexion of the right foot causing decreased range of motion.  Normal Achilles tendon.  Skin:    General: Skin is dry.  Neurological:     Mental Status: She is alert.     Sensory: No sensory deficit.  Psychiatric:        Mood and Affect: Mood normal.      ED Treatments / Results  Labs (all labs ordered are listed, but only abnormal results are displayed) Labs Reviewed - No data to display  EKG None  Radiology Dg Ankle 2 Views Right  Result Date: 09/17/2018 CLINICAL DATA:  Pain status post injury. EXAM: RIGHT ANKLE - 2 VIEW COMPARISON:  None. FINDINGS: There is no evidence of fracture, dislocation, or joint effusion. There is no evidence of arthropathy or other focal bone abnormality. Soft tissues are unremarkable. IMPRESSION: Negative. Electronically Signed   By: Katherine Mantlehristopher  Green M.D.   On: 09/17/2018 15:03   Dg Foot Complete Right  Result Date: 09/17/2018 CLINICAL DATA:  Lateral foot pain status post injury. EXAM: RIGHT FOOT COMPLETE - 3+ VIEW COMPARISON:  11/04/2016. FINDINGS: There is no evidence of fracture or dislocation. There is no evidence of arthropathy or other focal bone abnormality. Soft tissues are unremarkable. IMPRESSION: Negative. Electronically Signed   By: Katherine Mantlehristopher  Green M.D.   On:  09/17/2018 15:01    Procedures Procedures (including critical care time)  Medications Ordered in ED Medications  naproxen (NAPROSYN) tablet 500 mg (500 mg Oral Given 09/17/18 1511)     Initial Impression / Assessment and Plan / ED Course  I have reviewed the triage vital signs and the nursing notes.  Pertinent labs & imaging results that were available during my care of the patient were reviewed by me and considered in my medical decision making (see chart for details).        Based on review of  vitals, medical screening exam, lab work and/or imaging, there does not appear to be an acute, emergent etiology for the patient's symptoms. Counseled pt on good return precautions and encouraged both PCP and ED follow-up as needed.  Prior to discharge, I also discussed incidental imaging findings with patient in detail and advised appropriate, recommended follow-up in detail.  Clinical Impression: 1. Sprain of anterior talofibular ligament of right ankle, initial encounter     Disposition: Discharge  Prior to providing a prescription for a controlled substance, I independently reviewed the patient's recent prescription history on the [facility]. The patient had no recent or regular prescriptions and was deemed appropriate for a brief, less than 3 day prescription of narcotic for acute analgesia.  This note was prepared with assistance of Systems analyst. Occasional wrong-word or sound-a-like substitutions may have occurred due to the inherent limitations of voice recognition software.   Final Clinical Impressions(s) / ED Diagnoses   Final diagnoses:  Sprain of anterior talofibular ligament of right ankle, initial encounter    ED Discharge Orders    None       Kristine Royal 09/17/18 1514    Quintella Reichert, MD 09/17/18 1526

## 2018-09-17 NOTE — ED Triage Notes (Signed)
Her niece fell on her R foot 4 days ago. Ongoing pain and swelling.

## 2018-09-17 NOTE — ED Notes (Signed)
Patient transported to X-ray 

## 2018-09-17 NOTE — ED Notes (Signed)
ED Provider at bedside. 

## 2018-12-07 IMAGING — CT CT HEAD W/O CM
3 series · 14 of 46 positions shown, 16 images · non-contrast
Comparison: MRI of the brain performed 08/28/2005

CLINICAL DATA: Acute onset of posttraumatic headache.

EXAM:
CT HEAD WITHOUT CONTRAST
TECHNIQUE: Contiguous axial images were obtained from the base of the skull
through the vertex without intravenous contrast.

[Series 2: head wo · axial · 0.43mm/px · z∈[-134,-14]mm · 8 of 29 slices shown, 10 images]
[im 3/29  brain]
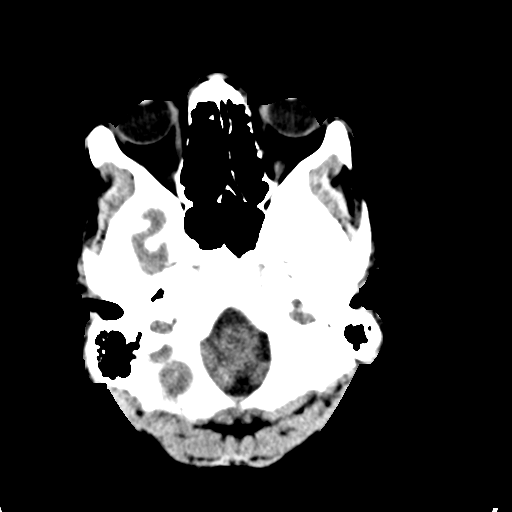
[im 3/29  bone]
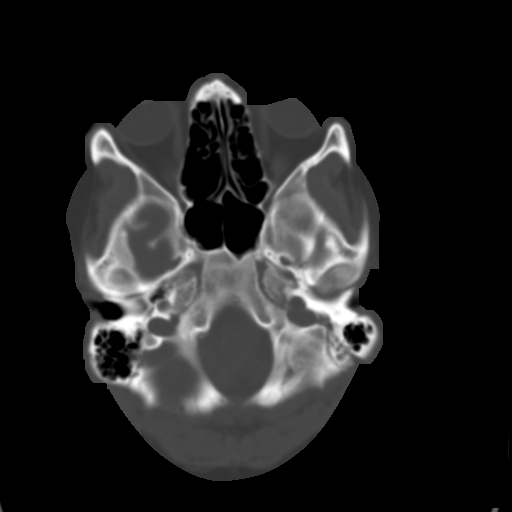
[im 7/29  brain]
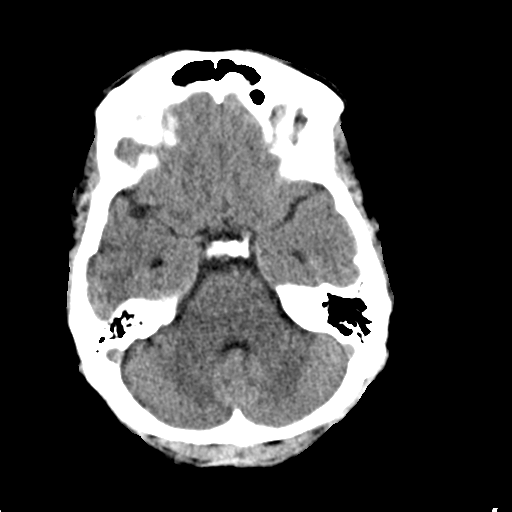
[im 10/29  brain]
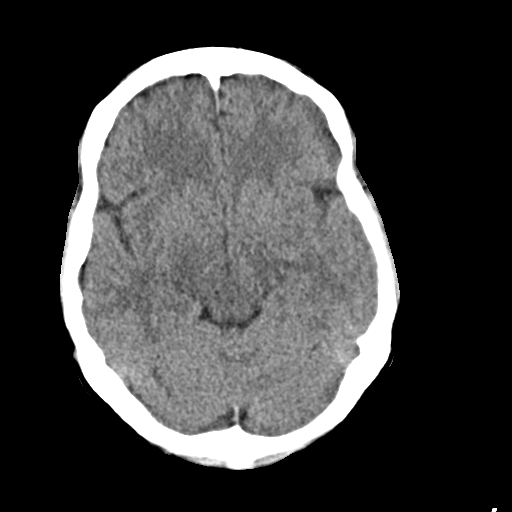
[im 13/29  brain]
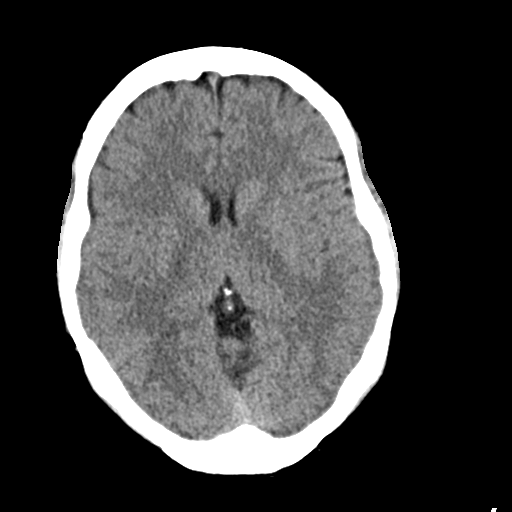
[im 17/29  brain]
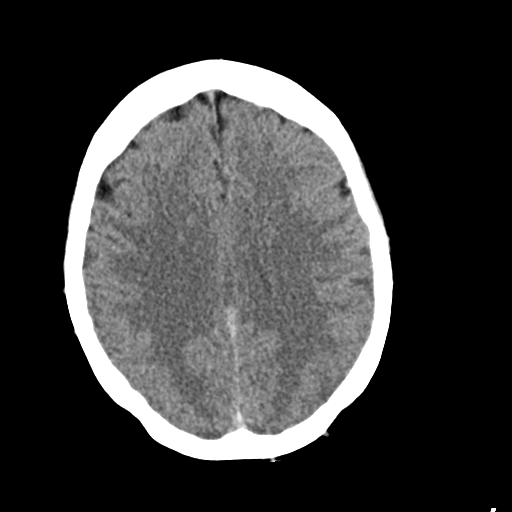
[im 17/29  bone]
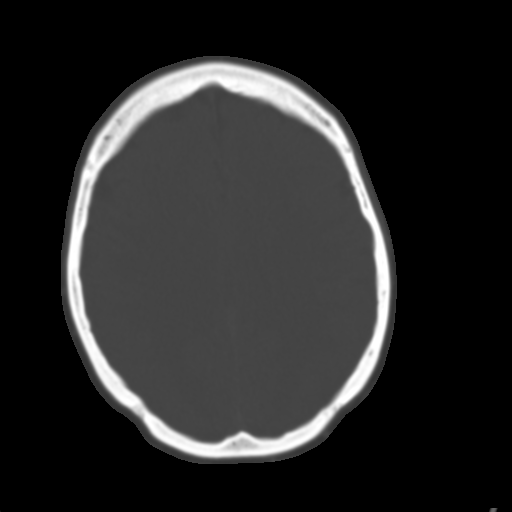
[im 20/29  brain]
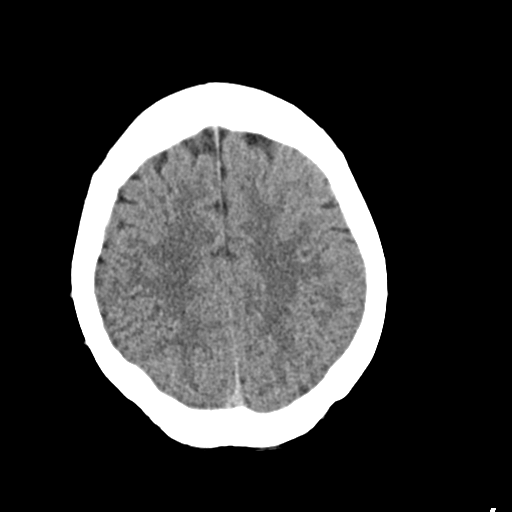
[im 23/29  brain]
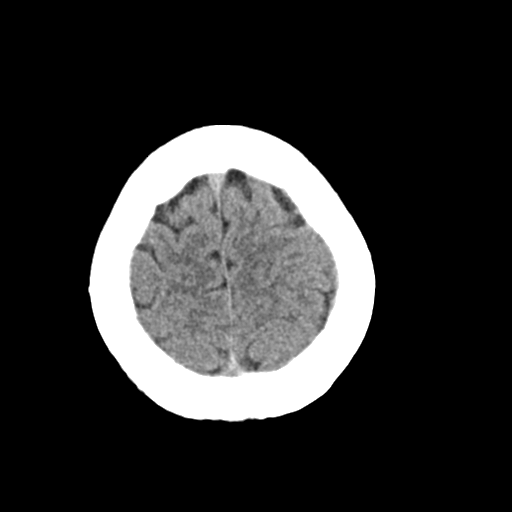
[im 27/29  brain]
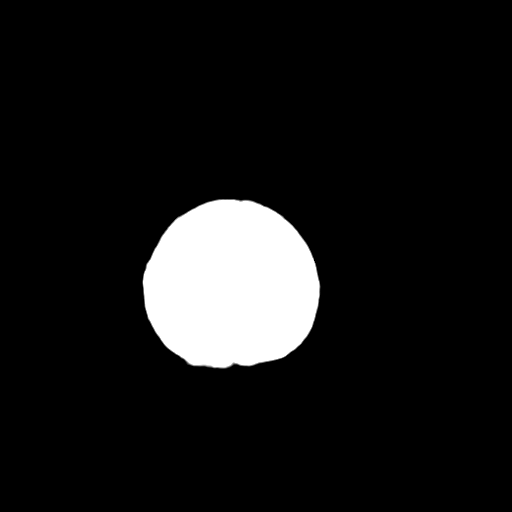

[Series 4: coronal soft · coronal · 0.30mm/px · 3 of 65 slices shown]
[im 22/65  brain]
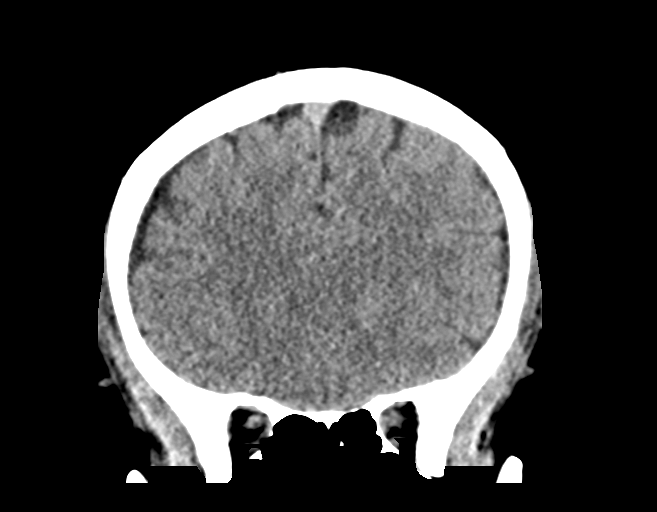
[im 29/65  brain]
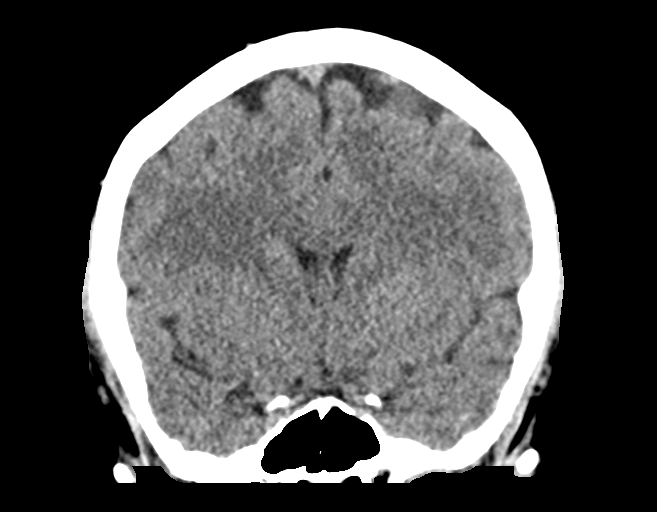
[im 36/65  brain]
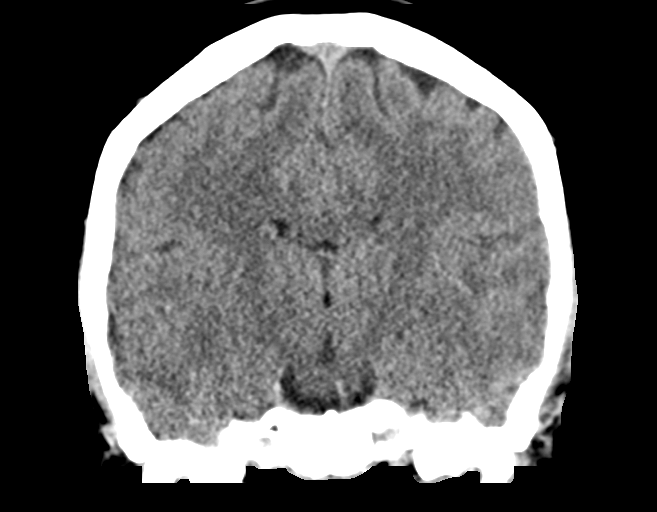

[Series 5: sag soft · sagittal · 0.31mm/px · 3 of 53 slices shown]
[im 18/53  brain]
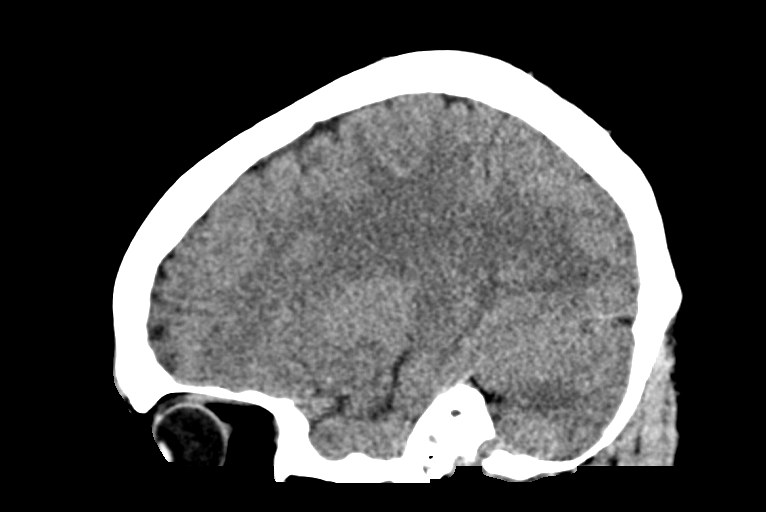
[im 27/53  brain]
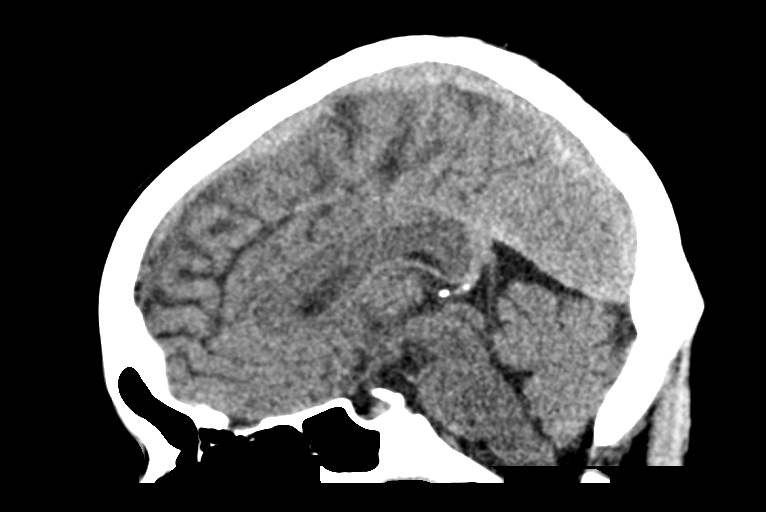
[im 35/53  brain]
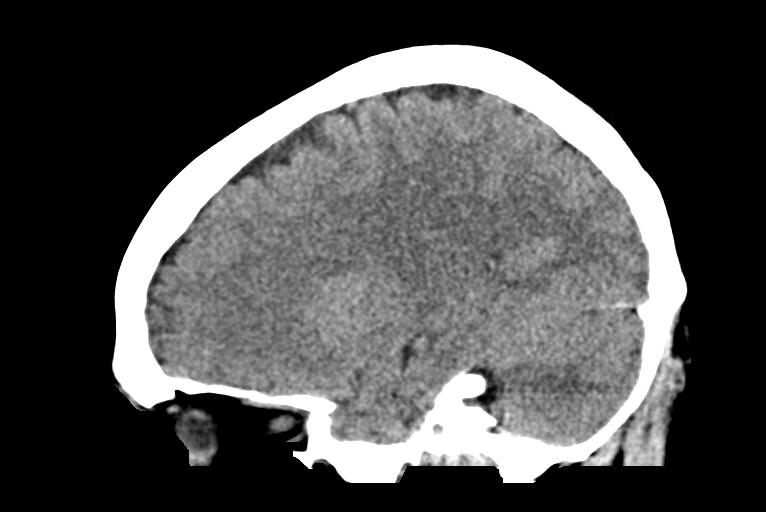

[14 of 46 positions shown; findings below may reference images not displayed]

FINDINGS: Brain: No evidence of acute infarction, hemorrhage, hydrocephalus,
extra-axial collection or mass lesion/mass effect.

The posterior fossa, including the cerebellum, brainstem and fourth
ventricle, is within normal limits. The third and lateral
ventricles, and basal ganglia are unremarkable in appearance. The
cerebral hemispheres are symmetric in appearance, with normal
gray-white differentiation. No mass effect or midline shift is seen.

Vascular: No hyperdense vessel or unexpected calcification.

Skull: There is no evidence of fracture; visualized osseous
structures are unremarkable in appearance.

Sinuses/Orbits: The visualized portions of the orbits are within
normal limits. The paranasal sinuses and mastoid air cells are
well-aerated.

Other: No significant soft tissue abnormalities are seen.
IMPRESSION: Unremarkable noncontrast CT of the head.

## 2019-10-01 DIAGNOSIS — M797 Fibromyalgia: Secondary | ICD-10-CM | POA: Insufficient documentation

## 2019-10-01 DIAGNOSIS — F4310 Post-traumatic stress disorder, unspecified: Secondary | ICD-10-CM | POA: Insufficient documentation

## 2019-12-06 ENCOUNTER — Encounter (HOSPITAL_BASED_OUTPATIENT_CLINIC_OR_DEPARTMENT_OTHER): Payer: Self-pay | Admitting: Emergency Medicine

## 2019-12-06 ENCOUNTER — Ambulatory Visit (HOSPITAL_BASED_OUTPATIENT_CLINIC_OR_DEPARTMENT_OTHER)
Admission: RE | Admit: 2019-12-06 | Discharge: 2019-12-06 | Disposition: A | Discharge status: Home / Self Care | Payer: 59 | Source: Ambulatory Visit | Attending: Emergency Medicine | Admitting: Emergency Medicine

## 2019-12-06 ENCOUNTER — Emergency Department (HOSPITAL_BASED_OUTPATIENT_CLINIC_OR_DEPARTMENT_OTHER)
Admission: EM | Admit: 2019-12-06 | Discharge: 2019-12-06 | Disposition: A | Discharge status: Home / Self Care | Payer: 59 | Attending: Emergency Medicine | Admitting: Emergency Medicine

## 2019-12-06 ENCOUNTER — Other Ambulatory Visit: Payer: Self-pay

## 2019-12-06 DIAGNOSIS — J45909 Unspecified asthma, uncomplicated: Secondary | ICD-10-CM | POA: Insufficient documentation

## 2019-12-06 DIAGNOSIS — R202 Paresthesia of skin: Secondary | ICD-10-CM | POA: Insufficient documentation

## 2019-12-06 DIAGNOSIS — R030 Elevated blood-pressure reading, without diagnosis of hypertension: Secondary | ICD-10-CM | POA: Insufficient documentation

## 2019-12-06 NOTE — ED Triage Notes (Signed)
Patient reports noticing her right hand was numb and blue in color.  She checked her O2 sat and it was 87% on the right and 97% on the left.  Hand is now pink, warm, and 100% on pulse oximeter.  Also reported feeling sob due to wearing two masks.  Denied having any SOB at home just since she put on the masks.

## 2019-12-06 NOTE — ED Provider Notes (Signed)
[facility] EMERGENCY DEPARTMENT Provider Note   CSN: [id number] Arrival date & time: 12/06/19  0121     History Chief Complaint  Patient presents with  . Hand numbness    Michele Marshall is a 39 y.o. female.  HPI     This a 39 year old female with a history of MS, IBS, fibromyalgia who presents with numbness and tingling of the right hand.  Patient reports that she was sitting in bed using her phone in her left hand when she noted tingling in the right hand.  She states that at baseline she has "difficulty with circulation in the right arm."  She states that she has seen a chiropractor and has had multiple tendon tears and that are.  She has been using kinesthetic tape on that arm for 3 weeks.  She states that previously she had been using Ace wraps but if they were at all too tight, she would get numbness and tingling in her hand.  She denies sleeping or position changes that would have cut off circulation to her hand.  She states that she placed her pulse ox on her right hand and noted O2 sats of 87% while the O2 sats in her left hand were 97%.  Symptoms persisted for approximately 15 minutes.  She reports discoloration of the tips of her fingers at that time.  She has never noted discoloration before.  No history of Raynaud's.  It was not particularly cold per the patient.  Currently she is symptom-free.  She states that she does feel somewhat short of breath but that started after placing to mask on her face.  She is not had any chest pain.  She takes a baby aspirin daily.  Past Medical History:  Diagnosis Date  . Asthma   . Celiac disease   . Fibromyalgia   . IBS (irritable bowel syndrome)   . Multiple sclerosis (HCC)   . Scoliosis     There are no problems to display for this patient.   History reviewed. No pertinent surgical history.   OB History   No obstetric history on file.     No family history on file.  Social History   Tobacco Use  . Smoking  status: Never Smoker  . Smokeless tobacco: Never Used  Substance Use Topics  . Alcohol use: Yes  . Drug use: No    Home Medications Prior to Admission medications   Medication Sig Start Date End Date Taking? Authorizing Provider  acetaminophen (TYLENOL) 325 MG tablet Take 650 mg by mouth every 6 (six) hours as needed. pain    [provider]  ALPHA LIPOIC ACID PO Take 1 tablet by mouth 2 (two) times daily.      [provider]  Chaste Tree 20 MG TABS Take 1 tablet by mouth 2 (two) times daily.      [provider]  cholecalciferol (VITAMIN D) 1000 UNITS tablet Take 1,000 Units by mouth daily.      [provider]  Coenzyme Q10 (CO Q 10) 100 MG CAPS Take 1 tablet by mouth daily.      [provider]  Evening Primrose Oil 500 MG CAPS Take 2 capsules by mouth daily.      [provider]  ibuprofen (ADVIL,MOTRIN) 200 MG tablet Take 800 mg by mouth every 6 (six) hours as needed. pain    [provider]  ibuprofen (ADVIL,MOTRIN) 800 MG tablet Take 1 tablet (800 mg total) 3 (three) times daily  by mouth. 11/28/16   Gilda Crease, MD  IODINE, KELP, PO Take 1 tablet by mouth daily.      [provider]  magnesium gluconate (MAGONATE) 500 MG tablet Take 1,500 mg by mouth daily.      [provider]  Multiple Vitamin (MULTIVITAMIN) tablet Take 1 tablet by mouth daily.      [provider]  naproxen (NAPROSYN) 500 MG tablet Take 1 tablet (500 mg total) by mouth 2 (two) times daily. 03/12/16   Barrett Henle, PA-C  traMADol (ULTRAM) 50 MG tablet Take 1 tablet (50 mg total) every 6 (six) hours as needed by mouth. 11/28/16   Pollina, Canary Brim, MD    Allergies    Penicillins, Sulfa antibiotics, Dayquil [pseudoephedrine-[id number]-apap], Diphenhydramine hcl, Gluten, Lactulose, Morphine and related, Nyquil [pseudoeph-doxylamine-dm-apap], Other, and Zofran  Review of Systems   Review of Systems    Constitutional: Negative for fever.  Respiratory: Negative for shortness of breath.   Cardiovascular: Negative for chest pain.  Gastrointestinal: Negative for abdominal pain, nausea and vomiting.  Skin: Positive for color change.  Neurological: Positive for numbness.  All other systems reviewed and are negative.   Physical Exam Updated Vital Signs BP (!) 163/96 (BP Location: Left Arm)   Pulse 84   Temp 98 F (36.7 C) (Oral)   Resp 18   Ht 1.626 m (5\' 4" )   Wt 81.6 kg   SpO2 100%   BMI 30.90 kg/m   Physical Exam Vitals and nursing note reviewed.  Constitutional:      Appearance: She is well-developed. She is not ill-appearing.  HENT:     Head: Normocephalic and atraumatic.     Nose: Nose normal.     Mouth/Throat:     Mouth: Mucous membranes are moist.  Eyes:     Pupils: Pupils are equal, round, and reactive to light.  Cardiovascular:     Rate and Rhythm: Normal rate and regular rhythm.     Heart sounds: Normal heart sounds.  Pulmonary:     Effort: Pulmonary effort is normal. No respiratory distress.     Breath sounds: No wheezing.  Abdominal:     Palpations: Abdomen is soft.     Tenderness: There is no abdominal tenderness.  Musculoskeletal:     Cervical back: Neck supple.     Comments: 2+ radial pulse bilateral wrists, no skin discoloration or deformities noted, kinesthetic tape noted over the right elbow  Skin:    General: Skin is warm and dry.     Capillary Refill: Capillary refill takes less than 2 seconds. Normal capillary refill right hand Neurological:     Mental Status: She is alert and oriented to person, place, and time.  Psychiatric:        Mood and Affect: Mood normal.     ED Results / Procedures / Treatments   Labs (all labs ordered are listed, but only abnormal results are displayed) Labs Reviewed - No data to display  EKG None  Radiology No results found.  Procedures Procedures (including critical care time)  Medications Ordered in  ED Medications - No data to display  ED Course  I have reviewed the triage vital signs and the nursing notes.  Pertinent labs & imaging results that were available during my care of the patient were reviewed by me and considered in my medical decision making (see chart for details).    MDM Rules/Calculators/A&P  Patient presents with concerns for discoloration and numbness in the right hand.  Symptoms have resolved.  Lasted 15 minutes.  She is overall nontoxic and vital signs are notable for blood pressure of 163/96.  Pulse ox on the right hand is 100% on arrival.  She has good distal pulses and good capillary refill.  Doubt acute arterial occlusion.  Given transient nature of symptoms and self-limited symptoms, suspect she may have experienced paresthesias in temporary diminished arterial flow.  This is often related to position changes although patient denies this.  Raynaud's is another consideration.  She does report a history of similar symptoms in the past.  She is not had any advanced imaging.  Have very low suspicion for acute thrombus or embolus at this time.  Blood pressures bilateral extremities are equal and normal.  Patient reassured.  Will obtain ultrasound imaging of the right upper extremity to rule out clot although I feel this is likely low yield.  Additionally, recommended that she continue taking baby aspirin for prophylaxis.  After history, exam, and medical workup I feel the patient has been appropriately medically screened and is safe for discharge home. Pertinent diagnoses were discussed with the patient. Patient was given return precautions.  Final Clinical Impression(s) / ED Diagnoses Final diagnoses:  Right hand paresthesia    Rx / DC Orders ED Discharge Orders         Ordered    US Venous Img Upper Uni Right        12/06/19 0200           Shon Baton, MD 12/06/19 [phone]

## 2019-12-06 NOTE — Discharge Instructions (Addendum)
You were seen today for numbness and tingling in the right hand.  Your exam is normal at this time.  Often times this is related to positional changes that temporarily restrict blood flow to the nurse in the distal arm and hand.  However, return tomorrow for ultrasound imaging.  Follow-up closely if symptoms return or persist.

## 2020-02-18 DIAGNOSIS — F331 Major depressive disorder, recurrent, moderate: Secondary | ICD-10-CM | POA: Insufficient documentation

## 2020-02-18 DIAGNOSIS — F4001 Agoraphobia with panic disorder: Secondary | ICD-10-CM | POA: Insufficient documentation

## 2020-06-20 ENCOUNTER — Ambulatory Visit (INDEPENDENT_AMBULATORY_CARE_PROVIDER_SITE_OTHER): Payer: 59 | Admitting: Nurse Practitioner

## 2020-06-20 ENCOUNTER — Other Ambulatory Visit: Payer: Self-pay

## 2020-06-20 ENCOUNTER — Encounter: Payer: Self-pay | Admitting: Nurse Practitioner

## 2020-06-20 VITALS — BP 160/100 | HR 88 | Temp 98.1°F | Ht 64.0 in | Wt 187.4 lb

## 2020-06-20 DIAGNOSIS — M797 Fibromyalgia: Secondary | ICD-10-CM

## 2020-06-20 DIAGNOSIS — G35 Multiple sclerosis: Secondary | ICD-10-CM | POA: Diagnosis not present

## 2020-06-20 DIAGNOSIS — I10 Essential (primary) hypertension: Secondary | ICD-10-CM

## 2020-06-20 LAB — COMPREHENSIVE METABOLIC PANEL
ALT: 19 U/L (ref 0–35)
AST: 20 U/L (ref 0–37)
Albumin: 4.6 g/dL (ref 3.5–5.2)
Alkaline Phosphatase: 68 U/L (ref 39–117)
BUN: 13 mg/dL (ref 6–23)
CO2: 21 mEq/L (ref 19–32)
Calcium: 9.8 mg/dL (ref 8.4–10.5)
Chloride: 106 mEq/L (ref 96–112)
Creatinine, Ser: 0.83 mg/dL (ref 0.40–1.20)
GFR: 87.76 mL/min (ref >60.00)
Glucose, Bld: 81 mg/dL (ref 70–99)
Potassium: 4.2 mEq/L (ref 3.5–5.1)
Sodium: 140 mEq/L (ref 135–145)
Total Bilirubin: 0.4 mg/dL (ref 0.2–1.2)
Total Protein: 7.1 g/dL (ref 6.0–8.3)

## 2020-06-20 LAB — CBC
HCT: 43.5 % (ref 36.0–46.0)
Hemoglobin: 14.6 g/dL (ref 12.0–15.0)
MCHC: 33.6 g/dL (ref 30.0–36.0)
MCV: 95.7 fl (ref 78.0–100.0)
Platelets: 228 10*3/uL (ref 150.0–400.0)
RBC: 4.54 Mil/uL (ref 3.87–5.11)
RDW: 13.5 % (ref 11.5–15.5)
WBC: 8.2 10*3/uL (ref 4.0–10.5)

## 2020-06-20 LAB — TSH: TSH: 2.97 u[IU]/mL (ref 0.35–4.50)

## 2020-06-20 MED ORDER — LABETALOL HCL 100 MG PO TABS: 50.0000 mg | ORAL_TABLET | Freq: Two times a day (BID) | ORAL | 5 refills | Status: AC

## 2020-06-20 MED ORDER — AMLODIPINE BESYLATE 5 MG PO TABS: 1.0000 | ORAL_TABLET | Freq: Every day | ORAL | 5 refills | Status: DC

## 2020-06-20 NOTE — Patient Instructions (Addendum)
Thank you for choosing [facility]  You will be contacted to schedule appt with neurology  Schedule f/up appt with psychiatry.  Go to lab for blood draw  Start amlodipine for hypertension Maintain DASH diet.  DASH Eating Plan DASH stands for Dietary Approaches to Stop Hypertension. The DASH eating plan is a healthy eating plan that has been shown to:  Reduce high blood pressure (hypertension).  Reduce your risk for type 2 diabetes, heart disease, and stroke.  Help with weight loss. What are tips for following this plan? Reading food labels  Check food labels for the amount of salt (sodium) per serving. Choose foods with less than 5 percent of the Daily Value of sodium. Generally, foods with less than 300 milligrams (mg) of sodium per serving fit into this eating plan.  To find whole grains, look for the word "whole" as the first word in the ingredient list. Shopping  Buy products labeled as "low-sodium" or "no salt added."  Buy fresh foods. Avoid canned foods and pre-made or frozen meals. Cooking  Avoid adding salt when cooking. Use salt-free seasonings or herbs instead of table salt or sea salt. Check with your health care provider or pharmacist before using salt substitutes.  Do not fry foods. Cook foods using healthy methods such as baking, boiling, grilling, roasting, and broiling instead.  Cook with heart-healthy oils, such as olive, canola, avocado, soybean, or sunflower oil. Meal planning  Eat a balanced diet that includes: ? 4 or more servings of fruits and 4 or more servings of vegetables each day. Try to fill one-half of your plate with fruits and vegetables. ? 6-8 servings of whole grains each day. ? Less than 6 oz (170 g) of lean meat, poultry, or fish each day. A 3-oz (85-g) serving of meat is about the same size as a deck of cards. One egg equals 1 oz (28 g). ? 2-3 servings of low-fat dairy each day. One serving is 1 cup (237 mL). ? 1 serving of  nuts, seeds, or beans 5 times each week. ? 2-3 servings of heart-healthy fats. Healthy fats called omega-3 fatty acids are found in foods such as walnuts, flaxseeds, fortified milks, and eggs. These fats are also found in cold-water fish, such as sardines, salmon, and mackerel.  Limit how much you eat of: ? Canned or prepackaged foods. ? Food that is high in trans fat, such as some fried foods. ? Food that is high in saturated fat, such as fatty meat. ? Desserts and other sweets, sugary drinks, and other foods with added sugar. ? Full-fat dairy products.  Do not salt foods before eating.  Do not eat more than 4 egg yolks a week.  Try to eat at least 2 vegetarian meals a week.  Eat more home-cooked food and less restaurant, buffet, and fast food.   Lifestyle  When eating at a restaurant, ask that your food be prepared with less salt or no salt, if possible.  If you drink alcohol: ? Limit how much you use to:  0-1 drink a day for women who are not pregnant.  0-2 drinks a day for men. ? Be aware of how much alcohol is in your drink. In the [place] one drink equals one 12 oz bottle of beer (355 mL), one 5 oz glass of wine (148 mL), or one 1 oz glass of hard liquor (44 mL). General information  Avoid eating more than 2,300 mg of salt a day. If you have hypertension,  you may need to reduce your sodium intake to 1,500 mg a day.  Work with your health care provider to maintain a healthy body weight or to lose weight. Ask what an ideal weight is for you.  Get at least 30 minutes of exercise that causes your heart to beat faster (aerobic exercise) most days of the week. Activities may include walking, swimming, or biking.  Work with your health care provider or dietitian to adjust your eating plan to your individual calorie needs. What foods should I eat? Fruits All fresh, dried, or frozen fruit. Canned fruit in natural juice (without added sugar). Vegetables Fresh or frozen vegetables  (raw, steamed, roasted, or grilled). Low-sodium or reduced-sodium tomato and vegetable juice. Low-sodium or reduced-sodium tomato sauce and tomato paste. Low-sodium or reduced-sodium canned vegetables. Grains Whole-grain or whole-wheat bread. Whole-grain or whole-wheat pasta. Brown rice. Orpah Cobb. Bulgur. Whole-grain and low-sodium cereals. Pita bread. Low-fat, low-sodium crackers. Whole-wheat flour tortillas. Meats and other proteins Skinless chicken or [place]. Ground chicken or [place]. Pork with fat trimmed off. Fish and seafood. Egg whites. Dried beans, peas, or lentils. Unsalted nuts, nut butters, and seeds. Unsalted canned beans. Lean cuts of beef with fat trimmed off. Low-sodium, lean precooked or cured meat, such as sausages or meat loaves. Dairy Low-fat (1%) or fat-free (skim) milk. Reduced-fat, low-fat, or fat-free cheeses. Nonfat, low-sodium ricotta or cottage cheese. Low-fat or nonfat yogurt. Low-fat, low-sodium cheese. Fats and oils Soft margarine without trans fats. Vegetable oil. Reduced-fat, low-fat, or light mayonnaise and salad dressings (reduced-sodium). Canola, safflower, olive, avocado, soybean, and sunflower oils. Avocado. Seasonings and condiments Herbs. Spices. Seasoning mixes without salt. Other foods Unsalted popcorn and pretzels. Fat-free sweets. The items listed above may not be a complete list of foods and beverages you can eat. Contact a dietitian for more information. What foods should I avoid? Fruits Canned fruit in a light or heavy syrup. Fried fruit. Fruit in cream or butter sauce. Vegetables Creamed or fried vegetables. Vegetables in a cheese sauce. Regular canned vegetables (not low-sodium or reduced-sodium). Regular canned tomato sauce and paste (not low-sodium or reduced-sodium). Regular tomato and vegetable juice (not low-sodium or reduced-sodium). Rosita Fire. Olives. Grains Baked goods made with fat, such as croissants, muffins, or some breads. Dry pasta  or rice meal packs. Meats and other proteins Fatty cuts of meat. Ribs. Fried meat. Tomasa Blase. Bologna, salami, and other precooked or cured meats, such as sausages or meat loaves. Fat from the back of a pig (fatback). Bratwurst. Salted nuts and seeds. Canned beans with added salt. Canned or smoked fish. Whole eggs or egg yolks. Chicken or [place] with skin. Dairy Whole or 2% milk, cream, and half-and-half. Whole or full-fat cream cheese. Whole-fat or sweetened yogurt. Full-fat cheese. Nondairy creamers. Whipped toppings. Processed cheese and cheese spreads. Fats and oils Butter. Stick margarine. Lard. Shortening. Ghee. Bacon fat. Tropical oils, such as coconut, palm kernel, or palm oil. Seasonings and condiments Onion salt, garlic salt, seasoned salt, table salt, and sea salt. Worcestershire sauce. Tartar sauce. Barbecue sauce. Teriyaki sauce. Soy sauce, including reduced-sodium. Steak sauce. Canned and packaged gravies. Fish sauce. Oyster sauce. Cocktail sauce. Store-bought horseradish. Ketchup. Mustard. Meat flavorings and tenderizers. Bouillon cubes. Hot sauces. Pre-made or packaged marinades. Pre-made or packaged taco seasonings. Relishes. Regular salad dressings. Other foods Salted popcorn and pretzels. The items listed above may not be a complete list of foods and beverages you should avoid. Contact a dietitian for more information. Where to find more information  National Heart, Lung,  and Blood Institute: PopSteam.is  American Heart Association: www.heart.org  Academy of Nutrition and Dietetics: www.eatright.org  National Kidney Foundation: www.kidney.org Summary  The DASH eating plan is a healthy eating plan that has been shown to reduce high blood pressure (hypertension). It may also reduce your risk for type 2 diabetes, heart disease, and stroke.  When on the DASH eating plan, aim to eat more fresh fruits and vegetables, whole grains, lean proteins, low-fat dairy, and  heart-healthy fats.  With the DASH eating plan, you should limit salt (sodium) intake to 2,300 mg a day. If you have hypertension, you may need to reduce your sodium intake to 1,500 mg a day.  Work with your health care provider or dietitian to adjust your eating plan to your individual calorie needs. This information is not intended to replace advice given to you by your health care provider. Make sure you discuss any questions you have with your health care provider. Document Revised: 12/04/2018 Document Reviewed: 12/04/2018 Elsevier Patient Education  2021 [employer].

## 2020-06-20 NOTE — Assessment & Plan Note (Addendum)
She was unable to complete eval with Dr. Anne Hahn neurology due to change in insurance plan. Referral entered today

## 2020-06-20 NOTE — Progress Notes (Signed)
Subjective:  Patient ID: Michele Marshall, female    DOB: 05-14-1980  Age: 40 y.o. MRN: [id number]  CC: Establish Care (New Patient - frequent dislocation of hips, jaws & shoulders)  Muscle Pain This is a chronic problem. The current episode started more than 1 year ago. The problem occurs constantly. The problem has been waxing and waning since onset. The context of the pain is unknown. The pain is present in the right hip, left hip, left arm and right arm. The pain is medium. Nothing aggravates the symptoms. Associated symptoms include fatigue. Pertinent negatives include no headaches, stiffness, sensory change, swollen glands or weakness. Past treatments include acetaminophen and OTC NSAID. The treatment provided moderate relief. There is no swelling present. She has been behaving normally. There is no history of chronic back pain, rheumatic disease or sickle cell disease.   Reviewed labs and OV visit notes from previous pcp, neurology and psychiatry   History of multiple sclerosis (HCC) She was unable to complete eval with Dr. Anne Hahn neurology due to change in insurance plan. Referral entered today  HTN (hypertension), benign Reports she is unable to tolerate amlodipine (caused elevated BP). Reported BP in supine position: 160/100 (both arms) BP Readings from Last 3 Encounters:  06/20/20 (!) 160/100  12/06/19 (!) 144/95  09/17/18 (!) 157/114   Check CBC, CMP, TSH Start labetalol 50mg  BID F/up in 2weeks  Fibromyalgia Labs completed 09/2019: vitamin D, B12, TSH, cbc, cmp (unremarkable). No hypermobility noted during exam today. No Fhx of Ehler's Danlos Syndrome. No need to genetic counselor referral at this time. Encourage to continue daily exercise and heart healthy diet.  Maintain multivitamin, CoQ10, and magnesium supplement. F/up with psychiatry to discuss medications like cymbalta or lyrica?   Reviewed past Medical, Social and Family history today.  Outpatient Medications  Prior to Visit  Medication Sig Dispense Refill  . acetaminophen (TYLENOL) 325 MG tablet Take 650 mg by mouth every 6 (six) hours as needed. pain    . Albuterol Sulfate (PROAIR RESPICLICK) 108 (90 Base) MCG/ACT AEPB Inhale into the lungs.    . ALPHA LIPOIC ACID PO Take 1 tablet by mouth 2 (two) times daily.    . Chaste Tree 20 MG TABS Take 1 tablet by mouth 2 (two) times daily.    . cholecalciferol (VITAMIN D) 1000 UNITS tablet Take 1,000 Units by mouth daily.    . cyclobenzaprine (FLEXERIL) 10 MG tablet Take by mouth.    . Evening Primrose Oil 500 MG CAPS Take 2 capsules by mouth daily.    10/2019 ibuprofen (ADVIL,MOTRIN) 200 MG tablet Take 800 mg by mouth every 6 (six) hours as needed. pain    . ibuprofen (ADVIL,MOTRIN) 800 MG tablet Take 1 tablet (800 mg total) 3 (three) times daily by mouth. 21 tablet 0  . IODINE, KELP, PO Take 1 tablet by mouth daily.    Marland Kitchen LORazepam (ATIVAN) 0.5 MG tablet Take by mouth.    . magnesium gluconate (MAGONATE) 500 MG tablet Take 1,500 mg by mouth daily.    . Multiple Vitamin (MULTIVITAMIN) tablet Take 1 tablet by mouth daily.    . naproxen (NAPROSYN) 500 MG tablet Take 1 tablet (500 mg total) by mouth 2 (two) times daily. 30 tablet 0  . traMADol (ULTRAM) 50 MG tablet Take 1 tablet (50 mg total) every 6 (six) hours as needed by mouth. 15 tablet 0  . clotrimazole-betamethasone (LOTRISONE) cream Apply topically 2 (two) times daily.    . Coenzyme Q10 (CO Q 10)  100 MG CAPS Take 1 tablet by mouth daily.      Marland Kitchen amLODipine (NORVASC) 5 MG tablet Take 1 tablet by mouth daily. (Patient not taking: Reported on 06/20/2020)     No facility-administered medications prior to visit.   ROS See HPI  Objective:  BP (!) 160/100 (BP Location: Left Arm, Patient Position: Supine)   Pulse 88   Temp 98.1 F (36.7 C)   Ht 5\' 4"  (1.626 m)   Wt 187 lb 6.4 oz (85 kg)   LMP 05/29/2020 (Within Days)   SpO2 98%   BMI 32.17 kg/m   Physical Exam Neck:     Thyroid: No thyroid mass,  thyromegaly or thyroid tenderness.  Cardiovascular:     Rate and Rhythm: Normal rate and regular rhythm.     Pulses: Normal pulses.     Heart sounds: Normal heart sounds.  Pulmonary:     Effort: Pulmonary effort is normal.     Breath sounds: Normal breath sounds.  Musculoskeletal:        General: No tenderness or deformity.     Right elbow: Normal.     Left elbow: Normal.     Right hand: Normal.     Left hand: Normal.     Cervical back: Normal range of motion and neck supple. No rigidity or tenderness. Muscular tenderness present. No pain with movement.     Lumbar back: Normal.     Right hip: Normal.     Left hip: Normal.     Right knee: Normal.     Left knee: Normal.  Lymphadenopathy:     Cervical: No cervical adenopathy.  Skin:    General: Skin is warm and dry.     Findings: No erythema or rash.  Neurological:     Mental Status: She is alert and oriented to person, place, and time.  Psychiatric:        Attention and Perception: Attention normal.        Mood and Affect: Mood is anxious.        Speech: Speech normal.        Behavior: Behavior is cooperative.        Thought Content: Thought content normal.        Cognition and Memory: Cognition normal.        Judgment: Judgment normal.    Assessment & Plan:  This visit occurred during the SARS-CoV-2 public health emergency.  Safety protocols were in place, including screening questions prior to the visit, additional usage of staff PPE, and extensive cleaning of exam room while observing appropriate contact time as indicated for disinfecting solutions.   Michele Marshall was seen today for establish care.  Diagnoses and all orders for this visit:  Primary hypertension -     CBC -     TSH -     Comprehensive metabolic panel -     Discontinue: amLODipine (NORVASC) 5 MG tablet; Take 1 tablet (5 mg total) by mouth at bedtime. -     labetalol (NORMODYNE) 100 MG tablet; Take 0.5 tablets (50 mg total) by mouth 2 (two) times  daily.  History of multiple sclerosis (HCC) -     Ambulatory referral to Neurology  Fibromyalgia   Problem List Items Addressed This Visit      Cardiovascular and Mediastinum   HTN (hypertension), benign - Primary    Reports she is unable to tolerate amlodipine (caused elevated BP). Reported BP in supine position: 160/100 (both arms) BP Readings from Last  3 Encounters:  06/20/20 (!) 160/100  12/06/19 (!) 144/95  09/17/18 (!) 157/114   Check CBC, CMP, TSH Start labetalol 50mg  BID F/up in 2weeks      Relevant Medications   labetalol (NORMODYNE) 100 MG tablet     Other   Fibromyalgia    Labs completed 09/2019: vitamin D, B12, TSH, cbc, cmp (unremarkable). No hypermobility noted during exam today. No Fhx of Ehler's Danlos Syndrome. No need to genetic counselor referral at this time. Encourage to continue daily exercise and heart healthy diet.  Maintain multivitamin, CoQ10, and magnesium supplement. F/up with psychiatry to discuss medications like cymbalta or lyrica?       Relevant Medications   cyclobenzaprine (FLEXERIL) 10 MG tablet   History of multiple sclerosis (HCC)    She was unable to complete eval with Dr. 10/2019 neurology due to change in insurance plan. Referral entered today      Relevant Orders   Ambulatory referral to Neurology      Follow-up: Return in about 4 weeks (around 07/18/2020) for CPE (fasting).  09/18/2020, NP

## 2020-06-20 NOTE — Assessment & Plan Note (Addendum)
Reports she is unable to tolerate amlodipine (caused elevated BP). Reported BP in supine position: 160/100 (both arms) BP Readings from Last 3 Encounters:  06/20/20 (!) 160/100  12/06/19 (!) 144/95  09/17/18 (!) 157/114   Check CBC, CMP, TSH Start labetalol 50mg  BID F/up in 2weeks

## 2020-06-20 NOTE — Assessment & Plan Note (Addendum)
Labs completed 09/2019: vitamin D, B12, TSH, cbc, cmp (unremarkable). No hypermobility noted during exam today. No Fhx of Ehler's Danlos Syndrome. No need to genetic counselor referral at this time. Encourage to continue daily exercise and heart healthy diet.  Maintain multivitamin, CoQ10, and magnesium supplement. F/up with psychiatry to discuss medications like cymbalta or lyrica?

## 2020-07-06 ENCOUNTER — Ambulatory Visit: Payer: 59 | Admitting: Nurse Practitioner

## 2020-08-30 ENCOUNTER — Encounter: Payer: 59 | Admitting: Nurse Practitioner

## 2020-09-21 ENCOUNTER — Telehealth: Payer: 59 | Admitting: Family Medicine

## 2020-09-21 DIAGNOSIS — U071 COVID-19: Secondary | ICD-10-CM | POA: Diagnosis not present

## 2020-09-21 DIAGNOSIS — G35 Multiple sclerosis: Secondary | ICD-10-CM | POA: Diagnosis not present

## 2020-09-21 MED ORDER — MOLNUPIRAVIR EUA 200MG CAPSULE: 4.0000 | ORAL_CAPSULE | Freq: Two times a day (BID) | ORAL | 0 refills | Status: AC

## 2020-09-21 NOTE — Patient Instructions (Signed)
Please keep well-hydrated and get plenty of rest. Start a saline nasal rinse to flush out your nasal passages. You can use plain Mucinex to help thin congestion. If you have a humidifier, running in the bedroom at night. I want you to start OTC vitamin D3 1000 units daily, vitamin C 1000 mg daily, and a zinc supplement. Please take prescribed medications as directed.  You were to quarantine for 5 days from onset of your symptoms.  After day 5, if you have had no fever and you are feeling better, you can end quarantine but need to mask for an additional 5 days. After day 5 if you have a fever or are having significant symptoms, please quarantine for full 10 days.  If you note any worsening of symptoms, any significant shortness of breath or any chest pain, please seek ER evaluation ASAP.  Please do not delay care!  COVID-19: What to Do if You Are Sick If you test positive and are an older adult or someone who is at high risk of getting very sick from COVID-19, treatment may be available. Contact a healthcare provider right away after a positive test to determine if you are eligible, even if your symptoms are mild right now. You can also visit a Test to Treat location and, if eligible, receive a prescription from a provider. Don't delay: Treatment must be started within the first few days to be effective. If you have a fever, cough, or other symptoms, you might have COVID-19. Most people have mild illness and are able to recover at home. If you are sick: Keep track of your symptoms. If you have an emergency warning sign (including trouble breathing), call 911. Steps to help prevent the spread of COVID-19 if you are sick If you are sick with COVID-19 or think you might have COVID-19, follow the steps below to care for yourself and to help protect other people in your home and community. Stay home except to get medical care Stay home. Most people with COVID-19 have mild illness and can recover at  home without medical care. Do not leave your home, except to get medical care. Do not visit public areas and do not go to places where you are unable to wear a mask. Take care of yourself. Get rest and stay hydrated. Take over-the-counter medicines, such as acetaminophen, to help you feel better. Stay in touch with your doctor. Call before you get medical care. Be sure to get care if you have trouble breathing, or have any other emergency warning signs, or if you think it is an emergency. Avoid public transportation, ride-sharing, or taxis if possible. Get tested If you have symptoms of COVID-19, get tested. While waiting for test results, stay away from others, including staying apart from those living in your household. Get tested as soon as possible after your symptoms start. Treatments may be available for people with COVID-19 who are at risk for becoming very sick. Don't delay: Treatment must be started early to be effective--some treatments must begin within 5 days of your first symptoms. Contact your healthcare provider right away if your test result is positive to determine if you are eligible. Self-tests are one of several options for testing for the virus that causes COVID-19 and may be more convenient than laboratory-based tests and point-of-care tests. Ask your healthcare provider or your local health department if you need help interpreting your test results. You can visit your state, tribal, local, and territorial health department's website to look   for the latest local information on testing sites. Separate yourself from other people As much as possible, stay in a specific room and away from other people and pets in your home. If possible, you should use a separate bathroom. If you need to be around other people or animals in or outside of the home, wear a well-fitting mask. Tell your close contacts that they may have been exposed to COVID-19. An infected person can spread COVID-19 starting  48 hours (or 2 days) before the person has any symptoms or tests positive. By letting your close contacts know they may have been exposed to COVID-19, you are helping to protect everyone. See COVID-19 and Animals if you have questions about pets. If you are diagnosed with COVID-19, someone from the health department may call you. Answer the call to slow the spread. Monitor your symptoms Symptoms of COVID-19 include fever, cough, or other symptoms. Follow care instructions from your healthcare provider and local health department. Your local health authorities may give instructions on checking your symptoms and reporting information. When to seek emergency medical attention Look for emergency warning signs* for COVID-19. If someone is showing any of these signs, seek emergency medical care immediately: Trouble breathing Persistent pain or pressure in the chest New confusion Inability to wake or stay awake Pale, gray, or blue-colored skin, lips, or nail beds, depending on skin tone *This list is not all possible symptoms. Please call your medical provider for any other symptoms that are severe or concerning to you. Call 911 or call ahead to your local emergency facility: Notify the operator that you are seeking care for someone who has or may have COVID-19. Call ahead before visiting your doctor Call ahead. Many medical visits for routine care are being postponed or done by phone or telemedicine. If you have a medical appointment that cannot be postponed, call your doctor's office, and tell them you have or may have COVID-19. This will help the office protect themselves and other patients. If you are sick, wear a well-fitting mask You should wear a mask if you must be around other people or animals, including pets (even at home). Wear a mask with the best fit, protection, and comfort for you. You don't need to wear the mask if you are alone. If you can't put on a mask (because of trouble  breathing, for example), cover your coughs and sneezes in some other way. Try to stay at least 6 feet away from other people. This will help protect the people around you. Masks should not be placed on young children under age 2 years, anyone who has trouble breathing, or anyone who is not able to remove the mask without help. Cover your coughs and sneezes Cover your mouth and nose with a tissue when you cough or sneeze. Throw away used tissues in a lined trash can. Immediately wash your hands with soap and water for at least 20 seconds. If soap and water are not available, clean your hands with an alcohol-based hand sanitizer that contains at least 60% alcohol. Clean your hands often Wash your hands often with soap and water for at least 20 seconds. This is especially important after blowing your nose, coughing, or sneezing; going to the bathroom; and before eating or preparing food. Use hand sanitizer if soap and water are not available. Use an alcohol-based hand sanitizer with at least 60% alcohol, covering all surfaces of your hands and rubbing them together until they feel dry. Soap and water are   the best option, especially if hands are visibly dirty. Avoid touching your eyes, nose, and mouth with unwashed hands. Handwashing Tips Avoid sharing personal household items Do not share dishes, drinking glasses, cups, eating utensils, towels, or bedding with other people in your home. Wash these items thoroughly after using them with soap and water or put in the dishwasher. Clean surfaces in your home regularly Clean and disinfect high-touch surfaces (for example, doorknobs, tables, handles, light switches, and countertops) in your "sick room" and bathroom. In shared spaces, you should clean and disinfect surfaces and items after each use by the person who is ill. If you are sick and cannot clean, a caregiver or other person should only clean and disinfect the area around you (such as your bedroom  and bathroom) on an as needed basis. Your caregiver/other person should wait as long as possible (at least several hours) and wear a mask before entering, cleaning, and disinfecting shared spaces that you use. Clean and disinfect areas that may have blood, stool, or body fluids on them. Use household cleaners and disinfectants. Clean visible dirty surfaces with household cleaners containing soap or detergent. Then, use a household disinfectant. Use a product from EPA's List N: Disinfectants for Coronavirus (COVID-19). Be sure to follow the instructions on the label to ensure safe and effective use of the product. Many products recommend keeping the surface wet with a disinfectant for a certain period of time (look at "contact time" on the product label). You may also need to wear personal protective equipment, such as gloves, depending on the directions on the product label. Immediately after disinfecting, wash your hands with soap and water for 20 seconds. For completed guidance on cleaning and disinfecting your home, visit Complete Disinfection Guidance. Take steps to improve ventilation at home Improve ventilation (air flow) at home to help prevent from spreading COVID-19 to other people in your household. Clear out COVID-19 virus particles in the air by opening windows, using air filters, and turning on fans in your home. Use this interactive tool to learn how to improve air flow in your home. When you can be around others after being sick with COVID-19 Deciding when you can be around others is different for different situations. Find out when you can safely end home isolation. For any additional questions about your care, contact your healthcare provider or state or local health department. 04/04/2020 Content source: National Center for Immunization and Respiratory Diseases (NCIRD), Division of Viral Diseases This information is not intended to replace advice given to you by your health care  provider. Make sure you discuss any questions you have with your health care provider. Document Revised: 05/18/2020 Document Reviewed: 05/18/2020 Elsevier Patient Education  2022 [employer] Inc.    

## 2020-09-21 NOTE — Progress Notes (Signed)
Pitcher,you are scheduled for a virtual visit with your provider today.    Just as we do with appointments in the office, we must obtain your consent to participate.  Your consent will be active for this visit and any virtual visit you may have with one of our providers in the next 365 days.    If you have a MyChart account, I can also send a copy of this consent to you electronically.  All virtual visits are billed to your insurance company just like a traditional visit in the office.  As this is a virtual visit, video technology does not allow for your provider to perform a traditional examination.  This may limit your provider's ability to fully assess your condition.  If your provider identifies any concerns that need to be evaluated in person or the need to arrange testing such as labs, EKG, etc, we will make arrangements to do so.    Although advances in technology are sophisticated, we cannot ensure that it will always work on either your end or our end.  If the connection with a video visit is poor, we may have to switch to a telephone visit.  With either a video or telephone visit, we are not always able to ensure that we have a secure connection.   I need to obtain your verbal consent now.   Are you willing to proceed with your visit today?   Michele Marshall has provided verbal consent on 09/21/2020 for a virtual visit (video or telephone).   Michele Finner, NP 09/21/2020  1:33 PM   Date:  09/21/2020   ID:  Michele Marshall, DOB 07-09-1980, MRN [id number]  Patient Location: Home Provider Location: Home Office   Participants: Patient and Provider for Visit and Wrap up  Method of visit: Video  Location of Patient: Home Location of Provider: Home Office Consent was obtain for visit over the video. Services rendered by provider: Visit was performed via video  A video enabled telemedicine application was used and I verified that I am speaking with the correct person using two  identifiers.  PCP:  Anne Ng, NP   Chief Complaint:  covid +  History of Present Illness:    Michele Marshall is a 40 y.o. adult with history as stated below. Presents video telehealth for an acute care visit due to a + COVID test She took it Tuesday morning.  Onset of symptoms was Monday with sore throat and symptoms have been persistent and include: felt tired and had fever when she woke up Tuesday. Fever 100 T max, Cough-productive -brown  does have chest pain thinks it is related to the cough. Left ear pain, sore throat has improved.   Denies having chills, shortness of breath, sore throat or exposure to covid or other sick contacts.  Modifying factors include: zinc and vit C No other aggravating or relieving factors.  No other c/o. Vaccinated  Hx of MS and Asthma- would like antivirals  Past Medical, Surgical, Social History, Allergies, and Medications have been Reviewed.  Past Medical History:  Diagnosis Date   Asthma    Celiac disease    Fibromyalgia    IBS (irritable bowel syndrome)    Multiple sclerosis (HCC)    Scoliosis     No outpatient medications have been marked as taking for the 09/21/20 encounter (Appointment) with [facility] PROVIDER.     Allergies:   Penicillins, Sulfa antibiotics, Diphenhydramine, Diphenhydramine hcl, Gluten meal, Lactulose, Morphine and related, Ondansetron, Pseudoephedrine-dm-gg-apap, Diphenhydramine  hcl, Gluten, Nyquil [pseudoeph-doxylamine-dm-apap], Other, Pseudoeph-doxylamine-dm-apap, and Zofran   ROS See HPI for history of present illness.  Physical Exam Constitutional:      Appearance: Normal appearance.  HENT:     Head: Normocephalic and atraumatic.     Right Ear: External ear normal.     Left Ear: External ear normal.     Nose: Nose normal.  Eyes:     Conjunctiva/sclera: Conjunctivae normal.  Pulmonary:     Effort: Pulmonary effort is normal.     Comments: No shortness of breath  Mild cough noted   Musculoskeletal:        General: Normal range of motion.  Neurological:     General: No focal deficit present.     Mental Status: Michele Brener "Maya" is alert and oriented to person, place, and time.  Psychiatric:        Mood and Affect: Mood normal.        Behavior: Behavior normal.        Thought Content: Thought content normal.              A&P  1. COVID-19 +HT OTC measures reviewed and on AVS Antivirals ordered  Reviewed side effects, risks and benefits of medication.   Patient acknowledged agreement and understanding of the plan.    2. History of multiple sclerosis (HCC) Well controlled- sees provider regularly for this   I discussed the assessment and treatment plan with the patient. The patient was provided an opportunity to ask questions and all were answered. The patient agreed with the plan and demonstrated an understanding of the instructions.   The patient was advised to call back or seek an in-person evaluation if the symptoms worsen or if the condition fails to improve as anticipated.   The above assessment and management plan was discussed with the patient. The patient verbalized understanding of and has agreed to the management plan. Patient is aware to call the clinic if symptoms persist or worsen. Patient is aware when to return to the clinic for a follow-up visit. Patient educated on when it is appropriate to go to the emergency department.    Time:   Today, I have spent 10 minutes with the patient with telehealth technology discussing the above problems, reviewing the chart, previous notes, medications and orders.   Medication Changes: No orders of the defined types were placed in this encounter.    Disposition:  Follow up PCP Signed, Michele Finner, NP  09/21/2020 1:33 PM

## 2020-09-25 IMAGING — DX DG ANKLE 2V *R*
2 series · 2 of 2 positions shown · non-contrast
Comparison: None.

CLINICAL DATA: Pain status post injury.

EXAM:
RIGHT ANKLE - 2 VIEW

[ankle ap]
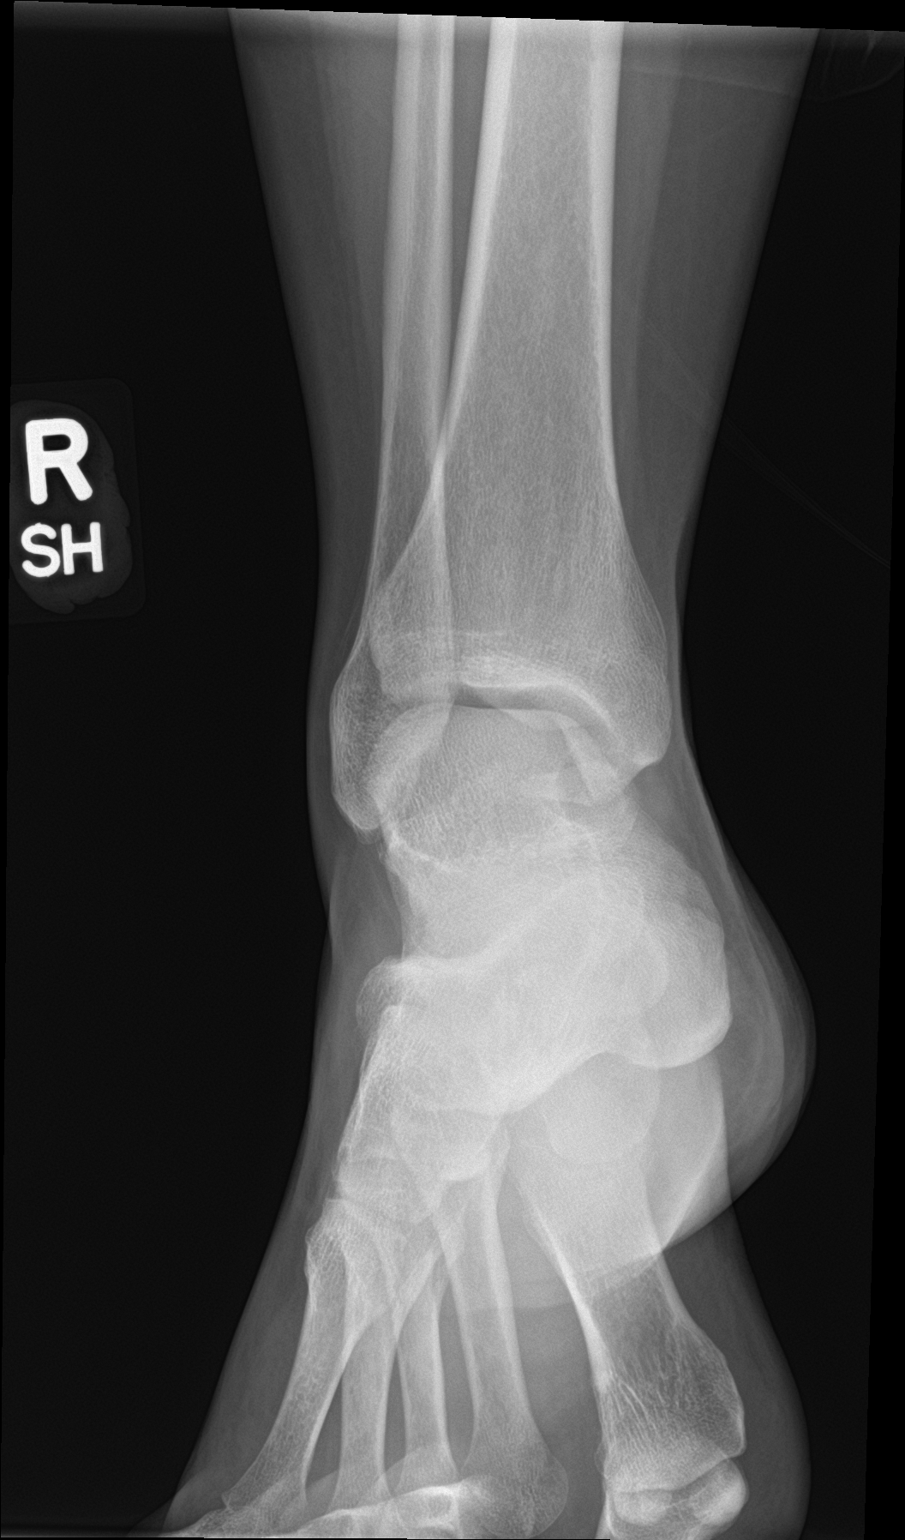

[ankle lat]
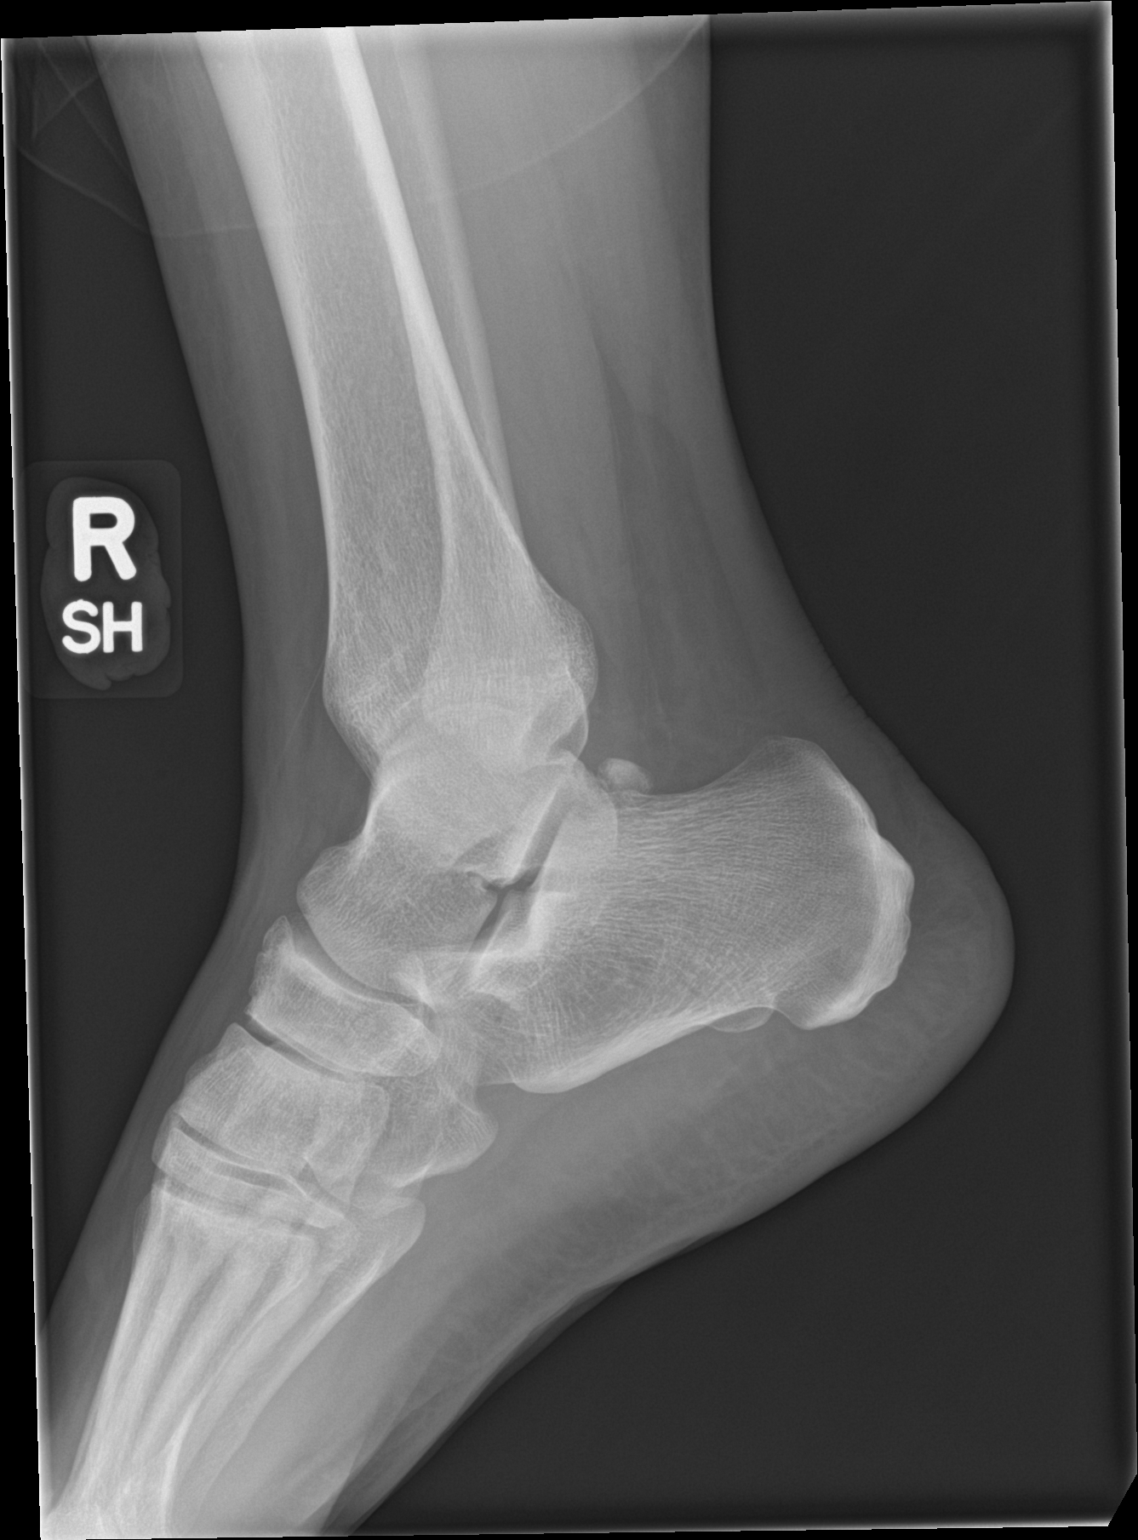

[2 of 2 positions shown; findings below may reference images not displayed]

FINDINGS: There is no evidence of fracture, dislocation, or joint effusion.
There is no evidence of arthropathy or other focal bone abnormality.
Soft tissues are unremarkable.
IMPRESSION: Negative.

## 2022-11-13 ENCOUNTER — Telehealth: Payer: Self-pay | Admitting: Nurse Practitioner

## 2022-11-13 NOTE — Telephone Encounter (Signed)
Lvmtcb to schedule an appt

## 2023-07-09 ENCOUNTER — Encounter: Payer: Self-pay | Admitting: Nurse Practitioner
